# Patient Record
Sex: Male | Born: 1960 | Race: White | Hispanic: No | Smoking: Never smoker
Health system: Southern US, Community
[De-identification: ages and names within clinical notes are randomized; demographics above are authoritative.]

## PROBLEM LIST (undated history)

## (undated) DIAGNOSIS — I10 Essential (primary) hypertension: Secondary | ICD-10-CM

## (undated) HISTORY — PX: APPENDECTOMY: SHX54

---

## 2012-11-14 ENCOUNTER — Ambulatory Visit: Payer: Self-pay | Admitting: Family Medicine

## 2012-11-24 ENCOUNTER — Ambulatory Visit: Payer: Self-pay | Admitting: Family Medicine

## 2013-01-31 ENCOUNTER — Ambulatory Visit: Payer: Self-pay | Admitting: Orthopedic Surgery

## 2013-03-18 ENCOUNTER — Emergency Department: Payer: Self-pay | Admitting: Emergency Medicine

## 2013-03-18 LAB — CBC
HCT: 43.5 % (ref 40.0–52.0)
MCH: 33.8 pg (ref 26.0–34.0)
MCHC: 35.6 g/dL (ref 32.0–36.0)
MCV: 95 fL (ref 80–100)
Platelet: 190 10*3/uL (ref 150–440)
RDW: 12.6 % (ref 11.5–14.5)
WBC: 11.1 10*3/uL — ABNORMAL HIGH (ref 3.8–10.6)

## 2013-03-18 LAB — URINALYSIS, COMPLETE
Ketone: NEGATIVE
Nitrite: NEGATIVE
RBC,UR: NONE SEEN /HPF (ref 0–5)

## 2013-03-18 LAB — BASIC METABOLIC PANEL
Anion Gap: 7 (ref 7–16)
BUN: 17 mg/dL (ref 7–18)
Calcium, Total: 9.3 mg/dL (ref 8.5–10.1)
Chloride: 103 mmol/L (ref 98–107)
Creatinine: 1.25 mg/dL (ref 0.60–1.30)
EGFR (Non-African Amer.): >60
Glucose: 112 mg/dL — ABNORMAL HIGH (ref 65–99)
Osmolality: 274 (ref 275–301)
Potassium: 3.1 mmol/L — ABNORMAL LOW (ref 3.5–5.1)
Sodium: 136 mmol/L (ref 136–145)

## 2013-03-29 ENCOUNTER — Ambulatory Visit: Payer: Self-pay | Admitting: Urology

## 2014-10-11 NOTE — Op Note (Signed)
PATIENT NAME:  Manuel Medina, Manuel Medina DATE OF BIRTH:  04/07/1961  DATE OF PROCEDURE:  01/31/2013  PREOPERATIVE DIAGNOSIS:  Right knee osteoarthritis, medial meniscus tear.  POSTOPERATIVE DIAGNOSIS:  Right knee osteoarthritis, medial meniscus tear with plica band.    PROCEDURE:  Arthroscopy, right knee, with partial medial meniscectomy, excision of plica.   ANESTHESIA:  General.   SURGEON:  Leitha SchullerMichael J. Aveena Bari, M.D.   DESCRIPTION OF PROCEDURE:  The patient was brought to the Operating Room and after adequate anesthesia was obtained, the right leg was prepped and draped in the usual sterile fashion with the arthroscopic legholder applied.  After patient identification and timeout procedures were completed, the inferolateral portal was made and the arthroscope was introduced.  Initial inspection revealed mild to moderate patellofemoral degenerative change and a thick medial plica band that impinged over the femoral condyle.  The gutters were checked and there were no loose bodies.  Coming around to the medial compartment there was exposed bone on both femoral and tibial condyles.  An inferomedial portal was made and upon probing there was a complex tear of the posterior horn that had both horizontal and vertical components involving most of the posterior third and a portion of the middle third.  The ACL was intact and the lateral compartment appeared essentially normal.  At this point, a meniscal punch was used to debride the meniscus back to a stable margin and an ArthroCare wand used to smooth the tissue.  On further probing, there did not appear to be anymore loose meniscal tissue.  The knee was thoroughly irrigated until clear and the plica band was ablated with the use of the ArthroCare wand.  Following this, the instrumentation was withdrawn and 20 mL of 0.5% Sensorcaine with epinephrine was infiltrated into the area of the portals for postop analgesia.  Xeroform, 4 x 4's, Webril and Ace wrap  applied and the patient was sent to the recovery room in stable condition.   ESTIMATED BLOOD LOSS:  Minimal.   COMPLICATIONS:  None.   SPECIMEN:  None.    ____________________________ Leitha SchullerMichael J. Mayank Teuscher, MD mjm:ea D: 01/31/2013 18:58:54 ET T: 02/01/2013 03:24:22 ET JOB#: [id number]  cc: Leitha SchullerMichael J. Mustafa Potts, MD, <Dictator> Leitha SchullerMICHAEL J Darby Shadwick MD ELECTRONICALLY SIGNED 02/01/2013 7:19

## 2015-06-05 ENCOUNTER — Ambulatory Visit
Admission: EM | Admit: 2015-06-05 | Discharge: 2015-06-05 | Disposition: A | Discharge status: Home / Self Care | Payer: Worker's Compensation | Attending: Family Medicine | Admitting: Family Medicine

## 2015-06-05 DIAGNOSIS — S61210A Laceration without foreign body of right index finger without damage to nail, initial encounter: Secondary | ICD-10-CM

## 2015-06-05 HISTORY — DX: Essential (primary) hypertension: I10

## 2015-06-05 MED ORDER — MUPIROCIN 2 % EX OINT: 1.0000 "application " | TOPICAL_OINTMENT | Freq: Three times a day (TID) | CUTANEOUS | Status: DC

## 2015-06-05 MED ORDER — ONDANSETRON 8 MG PO TBDP
8.0000 mg | ORAL_TABLET | Freq: Once | ORAL | Status: AC
  Administered 2015-06-05: 8 mg via ORAL

## 2015-06-05 MED ORDER — TETANUS-DIPHTH-ACELL PERTUSSIS 5-2.5-18.5 LF-MCG/0.5 IM SUSP
0.5000 mL | Freq: Once | INTRAMUSCULAR | Status: AC
  Administered 2015-06-05: 0.5 mL via INTRAMUSCULAR

## 2015-06-05 NOTE — ED Provider Notes (Signed)
CSN: [id number]     Arrival date & time 06/05/15  0913 History   First MD Initiated Contact with Patient 06/05/15 1007    Nurses notes were reviewed. Chief Complaint  Patient presents with  . Laceration    Laceration of right second finger. Patient reports carrying sheet metal slipped and cut his finger. Still bleeding profusely and she is nauseous and throwing up.  (Consider location/radiation/quality/duration/timing/severity/associated sxs/prior Treatment) Patient is a 54 y.o. male presenting with skin laceration. The history is provided by the patient. No language interpreter was used.  Laceration Location:  Finger Finger laceration location:  R index finger Depth:  Through underlying tissue Bleeding: venous   Time since incident:  1 hour Laceration mechanism:  Metal edge Pain details:    Quality:  Aching   Severity:  Moderate Foreign body present:  No foreign bodies Relieved by:  Nothing Worsened by:  Movement Ineffective treatments:  Pressure Tetanus status:  Out of date   Past Medical History  Diagnosis Date  . Hypertension    Past Surgical History  Procedure Laterality Date  . Appendectomy     Family History  Problem Relation Age of Onset  . Stroke Father    Social History  Substance Use Topics  . Smoking status: Never Smoker   . Smokeless tobacco: None  . Alcohol Use: No    Review of Systems  Skin: Positive for wound.  All other systems reviewed and are negative.   Allergies  Penicillins  Home Medications   Prior to Admission medications   Medication Sig Start Date End Date Taking? Authorizing Provider  mupirocin ointment (BACTROBAN) 2 % Apply 1 application topically 3 (three) times daily. 06/05/15   Hassan RowanEugene Chino Sardo, MD   Meds Ordered and Administered this Visit   Medications  Tdap (BOOSTRIX) injection 0.5 mL (0.5 mLs Intramuscular Given 06/05/15 0955)  ondansetron (ZOFRAN-ODT) disintegrating tablet 8 mg (8 mg Oral Given 06/05/15 1003)    BP  122/79 mmHg  Pulse 80  Temp(Src) 96.8 F (36 C) (Tympanic)  Resp 18  Ht 5\' 4"  (1.626 m)  Wt 164 lb (74.39 kg)  BMI 28.14 kg/m2  SpO2 99% No data found.   Physical Exam  Constitutional: He is oriented to person, place, and time. He appears well-developed and well-nourished.  HENT:  Head: Normocephalic.  Musculoskeletal: He exhibits tenderness.  Patient has laceration at the DIP joint. This about 6 cm in length and covers most the palmar surface of the second finger. He has good range of motion of the finger and stable and is able to bend it completely. Sensation is present at the tip. It is bleeding profusely.  Neurological: He is alert and oriented to person, place, and time.  Skin: Skin is warm.  Vitals reviewed.   ED Course  .Marland Kitchen.Laceration Repair Date/Time: 06/05/2015 10:43 AM Performed by: Hassan RowanWADE, Ciin Brazzel Authorized by: Hassan RowanWADE, Jacier Gladu Consent: Verbal consent obtained. Body area: upper extremity Location details: right index finger Wound length (cm): 6. Foreign bodies: metal Nerve involvement: none Vascular damage: yes Anesthesia: local infiltration Local anesthetic: lidocaine 1% without epinephrine Patient sedated: no Irrigation solution: saline Amount of cleaning: standard Debridement: none Degree of undermining: none Skin closure: 3-0 nylon and Ethilon Technique: simple Approximation: close Approximation difficulty: simple Dressing: 4x4 sterile gauze, antibiotic ointment and splint Comments: Patient tolerated suture repair of the finger well. We'll place a splint on and have her return in 2 weeks for suture removal and Bactrim ointment will be placed. With recommend he wear the  splint at work 6 sutures were placed using 3-0 Ethilon   (including critical care time)  Labs Review Labs Reviewed - No data to display  Imaging Review No results found.   Visual Acuity Review  Right Eye Distance:   Left Eye Distance:   Bilateral Distance:    Right Eye Near:    Left Eye Near:    Bilateral Near:         MDM   1. Laceration of right index finger w/o foreign body w/o damage to nail, initial encounter      Laceration repair done well patient tolerated suture. This was also given as well for update.  Hassan Rowan, MD 06/05/15 [phone]

## 2015-06-05 NOTE — Discharge Instructions (Signed)
Laceration Care, Adult °A laceration is a cut that goes through all layers of the skin. The cut also goes into the tissue that is right under the skin. Some cuts heal on their own. Others need to be closed with stitches (sutures), staples, skin adhesive strips, or wound glue. Taking care of your cut lowers your risk of infection and helps your cut to heal better. °HOW TO TAKE CARE OF YOUR CUT °For stitches or staples: °· Keep the wound clean and dry. °· If you were given a bandage (dressing), you should change it at least one time per day or as told by your doctor. You should also change it if it gets wet or dirty. °· Keep the wound completely dry for the first 24 hours or as told by your doctor. After that time, you may take a shower or a bath. However, make sure that the wound is not soaked in water until after the stitches or staples have been removed. °· Clean the wound one time each day or as told by your doctor: °· Wash the wound with soap and water. °· Rinse the wound with water until all of the soap comes off. °· Pat the wound dry with a clean towel. Do not rub the wound. °· After you clean the wound, put a thin layer of antibiotic ointment on it as told by your doctor. This ointment: °· Helps to prevent infection. °· Keeps the bandage from sticking to the wound. °· Have your stitches or staples removed as told by your doctor. °If your doctor used skin adhesive strips:  °· Keep the wound clean and dry. °· If you were given a bandage, you should change it at least one time per day or as told by your doctor. You should also change it if it gets dirty or wet. °· Do not get the skin adhesive strips wet. You can take a shower or a bath, but be careful to keep the wound dry. °· If the wound gets wet, pat it dry with a clean towel. Do not rub the wound. °· Skin adhesive strips fall off on their own. You can trim the strips as the wound heals. Do not remove any strips that are still stuck to the wound. They will  fall off after a while. °If your doctor used wound glue: °· Try to keep your wound dry, but you may briefly wet it in the shower or bath. Do not soak the wound in water, such as by swimming. °· After you take a shower or a bath, gently pat the wound dry with a clean towel. Do not rub the wound. °· Do not do any activities that will make you really sweaty until the skin glue has fallen off on its own. °· Do not apply liquid, cream, or ointment medicine to your wound while the skin glue is still on. °· If you were given a bandage, you should change it at least one time per day or as told by your doctor. You should also change it if it gets dirty or wet. °· If a bandage is placed over the wound, do not let the tape for the bandage touch the skin glue. °· Do not pick at the glue. The skin glue usually stays on for 5-10 days. Then, it falls off of the skin. °General Instructions  °· To help prevent scarring, make sure to cover your wound with sunscreen whenever you are outside after stitches are removed, after adhesive strips are removed,   or when wound glue stays in place and the wound is healed. Make sure to wear a sunscreen of at least 30 SPF. °· Take over-the-counter and prescription medicines only as told by your doctor. °· If you were given antibiotic medicine or ointment, take or apply it as told by your doctor. Do not stop using the antibiotic even if your wound is getting better. °· Do not scratch or pick at the wound. °· Keep all follow-up visits as told by your doctor. This is important. °· Check your wound every day for signs of infection. Watch for: °¨ Redness, swelling, or pain. °¨ Fluid, blood, or pus. °· Raise (elevate) the injured area above the level of your heart while you are sitting or lying down, if possible. °GET HELP IF: °· You got a tetanus shot and you have any of these problems at the injection site: °¨ Swelling. °¨ Very bad pain. °¨ Redness. °¨ Bleeding. °· You have a fever. °· A wound that was  closed breaks open. °· You notice a bad smell coming from your wound or your bandage. °· You notice something coming out of the wound, such as wood or glass. °· Medicine does not help your pain. °· You have more redness, swelling, or pain at the site of your wound. °· You have fluid, blood, or pus coming from your wound. °· You notice a change in the color of your skin near your wound. °· You need to change the bandage often because fluid, blood, or pus is coming from the wound. °· You start to have a new rash. °· You start to have numbness around the wound. °GET HELP RIGHT AWAY IF: °· You have very bad swelling around the wound. °· Your pain suddenly gets worse and is very bad. °· You notice painful lumps near the wound or on skin that is anywhere on your body. °· You have a red streak going away from your wound. °· The wound is on your hand or foot and you cannot move a finger or toe like you usually can. °· The wound is on your hand or foot and you notice that your fingers or toes look pale or bluish. °  °This information is not intended to replace advice given to you by your health care provider. Make sure you discuss any questions you have with your health care provider. °  °Document Released: 11/24/2007 Document Revised: 10/22/2014 Document Reviewed: 06/03/2014 °Elsevier Interactive Patient Education ©2016 [employer] ° °Stitches, Staples, or Adhesive Wound Closure °Doctors use stitches (sutures), staples, and certain glue (skin adhesives) to hold your skin together while it heals (wound closure). You may need this treatment after you have surgery or if you cut your skin accidentally. These methods help your skin heal more quickly. They also make it less likely that you will have a scar. °WHAT ARE THE DIFFERENT KINDS OF WOUND CLOSURES? °There are many options for wound closure. The one that your doctor uses depends on how deep and large your wound is. °Adhesive Glue °To use this glue to close a wound, your  doctor holds the edges of the wound together and paints the glue on the surface of your skin. You may need more than one layer of glue. Then the wound may be covered with a light bandage (dressing). °This type of skin closure may be used for small wounds that are not deep (superficial). Using glue for wound closure is less painful than other methods. It does not require   a medicine that numbs the area. This method also leaves nothing to be removed. Adhesive glue is often used for children and on facial wounds. °Adhesive glue cannot be used for wounds that are deep, uneven, or bleeding. It is not used inside of a wound.  °Adhesive Strips °These strips are made of sticky (adhesive), porous paper. They are placed across your skin edges like a regular adhesive bandage. You leave them on until they fall off. °Adhesive strips may be used to close very superficial wounds. They may also be used along with sutures to improve closure of your skin edges.  °Sutures °Sutures are the oldest method of wound closure. Sutures can be made from natural or synthetic materials. They can be made from a material that your body can break down as your wound heals (absorbable), or they can be made from a material that needs to be removed from your skin (nonabsorbable). They come in many different strengths and sizes. °Your doctor attaches the sutures to a steel needle on one end. Sutures can be passed through your skin, or through the tissues beneath your skin. Then they are tied and cut. Your skin edges may be closed in one continuous stitch or in separate stitches. °Sutures are strong and can be used for all kinds of wounds. Absorbable sutures may be used to close tissues under the skin. The disadvantage of sutures is that they may cause skin reactions that lead to infection. Nonabsorbable sutures need to be removed. °Staples °When surgical staples are used to close a wound, the edges of your skin on both sides of the wound are brought  close together. A staple is placed across the wound, and an instrument secures the edges together. Staples are often used to close surgical cuts (incisions). °Staples are faster to use than sutures, and they cause less reaction from your skin. Staples need to be removed using a tool that bends the staples away from your skin. °HOW DO I CARE FOR MY WOUND CLOSURE? °· Take medicines only as told by your doctor. °· If you were prescribed an antibiotic medicine for your wound, finish it all even if you start to feel better. °· Use ointments or creams only as told by your doctor. °· Wash your hands with soap and water before and after touching your wound. °· Do not soak your wound in water. Do not take baths, swim, or use a hot tub until your doctor says it is okay. °· Ask your doctor when you can start showering. Cover your wound if told by your doctor. °· Do not take out your own sutures or staples. °· Do not pick at your wound. Picking can cause an infection. °· Keep all follow-up visits as told by your doctor. This is important. °HOW LONG WILL I HAVE MY WOUND CLOSURE?  °· Leave adhesive glue on your skin until the glue peels away. °· Leave adhesive strips on your skin until they fall off. °· Absorbable sutures will dissolve within several days. °· Nonabsorbable sutures and staples must be removed. The location of the wound will determine how long they stay in. This can range from several days to a couple of weeks. °WHEN SHOULD I SEEK HELP FOR MY WOUND CLOSURE? °Contact your doctor if: °· You have a fever. °· You have chills. °· You have redness, puffiness (swelling), or pain at the site of your wound. °· You have fluid, blood, or pus coming from your wound. °· There is a bad smell coming   from your wound. °· The skin edges of your wound start to separate after your sutures have been removed. °· Your wound becomes thick, raised, and darker in color after your sutures come out (scarring). °  °This information is not  intended to replace advice given to you by your health care provider. Make sure you discuss any questions you have with your health care provider. °  °Document Released: 04/04/2009 Document Revised: 06/28/2014 Document Reviewed: 11/14/2013 °Elsevier Interactive Patient Education ©2016 [employer] ° °

## 2015-06-05 NOTE — ED Notes (Signed)
Patient's wound continues to bleed. Patient nauseated and vomited approximately 100 ml emesis. Dr. Thurmond ButtsWade informed and Zofran 8 mg OTD given as ordered.

## 2015-06-05 NOTE — ED Notes (Signed)
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 Has deep laceration to right 2nd finger and a more superficial one just below. Continues to bleed profusely. Dr. Thurmond ButtsWade to bedside. Patient applying pressure to wound with hand elevated

## 2015-07-02 ENCOUNTER — Encounter: Payer: Self-pay | Admitting: Emergency Medicine

## 2015-07-02 ENCOUNTER — Ambulatory Visit
Admission: EM | Admit: 2015-07-02 | Discharge: 2015-07-02 | Disposition: A | Discharge status: Home / Self Care | Payer: Worker's Compensation | Attending: Family Medicine | Admitting: Family Medicine

## 2015-07-02 DIAGNOSIS — S6991XD Unspecified injury of right wrist, hand and finger(s), subsequent encounter: Secondary | ICD-10-CM

## 2015-07-02 NOTE — ED Provider Notes (Signed)
Patient presents today for reevaluation of right second digit laceration. Patient states that the wound has healed however he has decreased range of motion at the DIP joint. He denies any signs of infection. He has been working in full capacity at his job. This was a workman's comp injury.  ROS: Negative except mentioned above.  Vitals as per Epic. GENERAL: NAD MSK: mild swelling distally of right second digit, wound healed, no signs of infection, decreased flexion at DIP, mild numbness along distal radial aspect of digit (states improving), vascularly intact  NEURO: CN II-XII grossly intact    A/P: R Second Digit Injury- Wound healed. Recommend f/u with Tommie Maximiano CossAnne Moore FNP, patient may require OT/PT referral regarding range of motion, patient states that he will discuss this with his case manager, taper were given to patient discussing above.   Jolene ProvostKirtida Lorane Cousar, MD 07/02/15 [phone]

## 2015-07-02 NOTE — ED Notes (Signed)
Patient here for right 2nd finger wound check.  Patient has healed laceration repair with no signs of infection.

## 2016-09-13 ENCOUNTER — Encounter: Payer: Self-pay | Admitting: Emergency Medicine

## 2016-09-13 ENCOUNTER — Ambulatory Visit
Admission: EM | Admit: 2016-09-13 | Discharge: 2016-09-13 | Disposition: A | Discharge status: Home / Self Care | Payer: 59 | Attending: Emergency Medicine | Admitting: Emergency Medicine

## 2016-09-13 ENCOUNTER — Ambulatory Visit (INDEPENDENT_AMBULATORY_CARE_PROVIDER_SITE_OTHER): Payer: 59

## 2016-09-13 DIAGNOSIS — E876 Hypokalemia: Secondary | ICD-10-CM | POA: Diagnosis not present

## 2016-09-13 DIAGNOSIS — I10 Essential (primary) hypertension: Secondary | ICD-10-CM

## 2016-09-13 DIAGNOSIS — M25562 Pain in left knee: Secondary | ICD-10-CM

## 2016-09-13 LAB — BASIC METABOLIC PANEL
Anion gap: 10 (ref 5–15)
BUN: 14 mg/dL (ref 6–20)
CO2: 25 mmol/L (ref 22–32)
CREATININE: 1.21 mg/dL (ref 0.61–1.24)
Calcium: 9.2 mg/dL (ref 8.9–10.3)
Chloride: 98 mmol/L — ABNORMAL LOW (ref 101–111)
GFR calc Af Amer: >60 mL/min (ref >60)
GLUCOSE: 101 mg/dL — AB (ref 65–99)
Potassium: 3.3 mmol/L — ABNORMAL LOW (ref 3.5–5.1)
Sodium: 133 mmol/L — ABNORMAL LOW (ref 135–145)

## 2016-09-13 LAB — CBC WITH DIFFERENTIAL/PLATELET
Basophils Absolute: 0 10*3/uL (ref 0–0.1)
Basophils Relative: 0 %
EOS ABS: 0 10*3/uL (ref 0–0.7)
EOS PCT: 0 %
HCT: 45.2 % (ref 40.0–52.0)
Hemoglobin: 15.5 g/dL (ref 13.0–18.0)
LYMPHS ABS: 1.1 10*3/uL (ref 1.0–3.6)
LYMPHS PCT: 10 %
MCH: 32.6 pg (ref 26.0–34.0)
MCHC: 34.4 g/dL (ref 32.0–36.0)
MCV: 94.7 fL (ref 80.0–100.0)
Monocytes Absolute: 1.1 10*3/uL — ABNORMAL HIGH (ref 0.2–1.0)
Monocytes Relative: 9 %
Neutro Abs: 9 10*3/uL — ABNORMAL HIGH (ref 1.4–6.5)
Neutrophils Relative %: 81 %
PLATELETS: 209 10*3/uL (ref 150–440)
RBC: 4.78 MIL/uL (ref 4.40–5.90)
RDW: 12.6 % (ref 11.5–14.5)
WBC: 11.2 10*3/uL — ABNORMAL HIGH (ref 3.8–10.6)

## 2016-09-13 LAB — URIC ACID: Uric Acid, Serum: 8.3 mg/dL — ABNORMAL HIGH (ref 4.4–7.6)

## 2016-09-13 MED ORDER — KETOROLAC TROMETHAMINE 60 MG/2ML IM SOLN
30.0000 mg | Freq: Once | INTRAMUSCULAR | Status: AC
  Administered 2016-09-13: 30 mg via INTRAMUSCULAR

## 2016-09-13 MED ORDER — LISINOPRIL 5 MG PO TABS: 5.0000 mg | ORAL_TABLET | Freq: Every day | ORAL | 0 refills | Status: DC

## 2016-09-13 MED ORDER — PREDNISONE 10 MG PO TABS: ORAL_TABLET | ORAL | 0 refills | Status: DC

## 2016-09-13 MED ORDER — ONDANSETRON 8 MG PO TBDP
8.0000 mg | ORAL_TABLET | Freq: Once | ORAL | Status: AC
  Administered 2016-09-13: 8 mg via ORAL

## 2016-09-13 MED ORDER — POTASSIUM CHLORIDE CRYS ER 20 MEQ PO TBCR
20.0000 meq | EXTENDED_RELEASE_TABLET | Freq: Once | ORAL | Status: AC
  Administered 2016-09-13: 20 meq via ORAL

## 2016-09-13 MED ORDER — ONDANSETRON HCL 4 MG/2ML IJ SOLN: 8.0000 mg | Freq: Once | INTRAMUSCULAR | Status: DC

## 2016-09-13 MED ORDER — OXYCODONE-ACETAMINOPHEN 5-325 MG PO TABS: 1.0000 | ORAL_TABLET | Freq: Three times a day (TID) | ORAL | 0 refills | Status: DC | PRN

## 2016-09-13 NOTE — ED Provider Notes (Signed)
[facility] ____________________________________________  Time seen: Approximately 1:35 PM  I have reviewed the triage vital signs and the nursing notes.   HISTORY  Chief Complaint Knee Pain (left)   HPI Manuel Medina is a 56 y.o. male presenting for the complaints of 3 days of left knee pain. Patient reports woke up with pain and states pain is severe. Patient reports he has a history of same type of pain occurring in the past and states if he gets to taking ibuprofen and icing it usually it would go away. Patient reports pain has continued and not gone away. Reports he has been evaluated for gout in the past and told it was negative. Patient reports he has remained ambulatory but today he has been using crutches because of the pain but able to still walk. Denies any fall, trauma or injury. Reports he has chronic bilateral knee problems including osteoarthritis. Denies any recent joint pain. Denies family history of gout. Denies renal insufficiency. Denies recent diet changes. Denies known trigger.  States pain is mostly with direct palpation and movement. States pain is currently 9 out of 10 to left anterior knee. Denies any pain in the back of the knee or the rest of the leg. Denies any break in skin, rash or skin changes. Reports has continued to eat and drink well. No medications being taken today. Reports last took Advil yesterday. Reports otherwise feeling well.  Denies chest pain, shortness of breath, abdominal pain, headaches, dizziness, vision changes, weakness, dysuria, extremity pain, extremity swelling or rash. Denies recent sickness. Denies recent antibiotic use.   Patient reports that he does have a history of high blood pressure and thinks he was taking lisinopril. Reports he has not been taking his blood pressure medication in well over a year as he stopped following up with his Dr.   Past Medical History:  Diagnosis Date  . Hypertension     There are no  active problems to display for this patient.   Past Surgical History:  Procedure Laterality Date  . APPENDECTOMY       No current facility-administered medications for this encounter.   Current Outpatient Prescriptions:  .  lisinopril (PRINIVIL,ZESTRIL) 5 MG tablet, Take 1 tablet (5 mg total) by mouth daily., Disp: 21 tablet, Rfl: 0 .  oxyCODONE-acetaminophen (ROXICET) 5-325 MG tablet, Take 1 tablet by mouth every 8 (eight) hours as needed for moderate pain or severe pain (Do not drive or operate heavy machinery while taking as can cause drowsiness.)., Disp: 9 tablet, Rfl: 0 .  predniSONE (DELTASONE) 10 MG tablet, Start 60 mg po day one, then 50 mg po day two, taper by 10 mg daily until complete., Disp: 21 tablet, Rfl: 0  Allergies Penicillins  Family History  Problem Relation Age of Onset  . Stroke Father     Social History Social History  Substance Use Topics  . Smoking status: Never Smoker  . Smokeless tobacco: Never Used  . Alcohol use No    Review of Systems Constitutional: No fever/chills Eyes: No visual changes. ENT: No sore throat. Cardiovascular: Denies chest pain. Respiratory: Denies shortness of breath. Gastrointestinal: No abdominal pain.  No nausea, no vomiting.  No diarrhea.  No constipation. Genitourinary: Negative for dysuria. Musculoskeletal: Negative for back pain.As above. Skin: Negative for rash. Neurological: Negative for  focal weakness or numbness.  10-point ROS otherwise negative.  ____________________________________________   PHYSICAL EXAM:  VITAL SIGNS: ED Triage Vitals  Enc Vitals Group     BP  09/13/16 1304 (S) (!) 180/105     Pulse Rate 09/13/16 1304 81     Resp 09/13/16 1304 17     Temp 09/13/16 1304 98.4 F (36.9 C)     Temp Source 09/13/16 1304 Oral     SpO2 09/13/16 1304 98 %     Weight 09/13/16 1305 156 lb (70.8 kg)     Height 09/13/16 1305 5\' 4"  (1.626 m)     Head Circumference --      Peak Flow --      Pain Score  09/13/16 1305 10     Pain Loc --      Pain Edu? --      Excl. in GC? --    Vitals:   09/13/16 1304 09/13/16 1305 09/13/16 1414 09/13/16 1456  BP: (S) (!) 180/105  (!) 199/113 (!) 160/92  Pulse: 81     Resp: 17     Temp: 98.4 F (36.9 C)     TempSrc: Oral     SpO2: 98%     Weight:  156 lb (70.8 kg)    Height:  5\' 4"  (1.626 m)     Constitutional: Alert and oriented. Well appearing and in no acute distress. Eyes: Conjunctivae are normal. PERRL. EOMI. ENT      Head: Normocephalic and atraumatic.      Mouth/Throat: Mucous membranes are moist. Cardiovascular: Normal rate, regular rhythm. Grossly normal heart sounds.  Good peripheral circulation. Respiratory: Normal respiratory effort without tachypnea nor retractions. Breath sounds are clear and equal bilaterally. No wheezes, rales, rhonchi. Gastrointestinal: Soft and nontender. No distention.  No CVA tenderness. Musculoskeletal No midline cervical, thoracic or lumbar tenderness to palpation. Bilateral pedal pulses equal and easily palpated. Except: Left anterior knee diffuse edema and tenderness to anterior knee, guarding knee, no posterior knee or posterior leg tenderness, calf nontender, patient refuses to attempt at range of motion stating due to pain, no erythema or skin changes, skin appears to be intact, anterior knee warm to touch, left lower extremity nontender otherwise. Left lower extremity otherwise with normal range of motion. Neurologic:  Normal speech and language. Speech is normal. No gait instability.  Skin:  Skin is warm, dry and intact. No rash noted. Psychiatric: Mood and affect are normal. Speech and behavior are normal. Patient exhibits appropriate insight and judgment   ___________________________________________   LABS (all labs ordered are listed, but only abnormal results are displayed)  Labs Reviewed  CBC WITH DIFFERENTIAL/PLATELET - Abnormal; Notable for the following:       Result Value   WBC 11.2 (*)     Neutro Abs 9.0 (*)    Monocytes Absolute 1.1 (*)    All other components within normal limits  BASIC METABOLIC PANEL - Abnormal; Notable for the following:    Sodium 133 (*)    Potassium 3.3 (*)    Chloride 98 (*)    Glucose, Bld 101 (*)    All other components within normal limits  URIC ACID - Abnormal; Notable for the following:    Uric Acid, Serum 8.3 (*)    All other components within normal limits    RADIOLOGY  Dg Knee Complete 4 Views Left  Result Date: 09/13/2016 CLINICAL DATA:  Anterior knee pain and swelling. EXAM: LEFT KNEE - COMPLETE 4+ VIEW COMPARISON:  None. FINDINGS: There is a knee effusion in the suprapatellar bursa. Marginal spurring and some loss of articular space in the patellofemoral joint. Low-level prepatellar subcutaneous edema. No visible fracture. Mild  degenerative chondral thinning in the medial compartment. IMPRESSION: 1. Knee effusion in the suprapatellar bursa. 2. Chondral thinning in the patellofemoral joint and to a lesser extent in the medial compartment. 3. If pain persists despite conservative therapy, MRI may be warranted for further characterization. Electronically Signed   By: Gaylyn Rong M.D.   On: 09/13/2016 14:12   ____________________________________________   PROCEDURES Procedures    INITIAL IMPRESSION / ASSESSMENT AND PLAN / ED COURSE  Pertinent labs & imaging results that were available during my care of the patient were reviewed by me and considered in my medical decision making (see chart for details).  Patient regarding left knee and states unable to move due to the pain. Patient states he has full range of motion but does not allow for full exam based on pain and guarding. Discussed in detail with patient recommend further evaluation in the emergency room due to pain complaints as well as for further evaluation including laboratory studies, pain medication and possible knee arthrocentesis, including up to excluding septic joint.  Patient states he does not want to go to the ER and will not be going to the emergency room. Patient states he understands his risks of infection and worsening knee pain and condition and does not want to go to the ER. Patient agrees for left knee x-ray as well as laboratory studies to be done in urgent care at this time. Will evaluate CBC, BMP uric acid and left knee x-ray.  X-ray results per radiologist knee effusion in the suprapatellar bursa, chondral thinning in the patellofemoral joint and to a lesser extent the medial compartment, no visible fracture. 30 mg IM Toradol given once in urgent care, and patient reports mild improvement of pain post toradol.   Laboratory results reviewed, and discussed with patient. Potassium 3.3, 20 mEq of potassium given in urgent care. Discussed other lab results with patient, including uric acid elevation. Suspect left knee pain acute gout. As patient's symptoms have been for the last 3 days no clear indication for colchicine use. Will treat patient with prednisone taper and when necessary Percocet for breakthrough pain. [place] controlled substance database reviewed, no controlled substances documented in last 1 year. Continue crutches and supportive care.  Patient also with history of chronic hypertension and not currently on any medications. Patient agrees to establish and follow up with primary care physician. Will restart patient on oral 5 mg lisinopril and directed for close follow-up and monitoring.Discussed indication, risks and benefits of medications with patient.  Discussed follow up with Primary care physician this week. Discussed follow up and return parameters including no resolution or any worsening concerns. Patient verbalized understanding and agreed to plan.   ____________________________________________   FINAL CLINICAL IMPRESSION(S) / ED DIAGNOSES  Final diagnoses:  Acute pain of left knee  Hypertension, unspecified type  Hypokalemia      Discharge Medication List as of 09/13/2016  2:47 PM    START taking these medications   Details  lisinopril (PRINIVIL,ZESTRIL) 5 MG tablet Take 1 tablet (5 mg total) by mouth daily., Starting Mon 09/13/2016, Normal    oxyCODONE-acetaminophen (ROXICET) 5-325 MG tablet Take 1 tablet by mouth every 8 (eight) hours as needed for moderate pain or severe pain (Do not drive or operate heavy machinery while taking as can cause drowsiness.)., Starting Mon 09/13/2016, Print    predniSONE (DELTASONE) 10 MG tablet Start 60 mg po day one, then 50 mg po day two, taper by 10 mg daily until complete., Normal  Note: This dictation was prepared with Dragon dictation along with smaller phrase technology. Any transcriptional errors that result from this process are unintentional.         Renford Dills, NP 09/13/16 1608    Renford Dills, NP 09/13/16 1609

## 2016-09-13 NOTE — Discharge Instructions (Signed)
Take medication as prescribed. Rest. Elevate. Drink plenty of fluids.   Establish Primary care and closely follow up. You need blood pressure to be closely monitored as well as potassium and labs.   Follow up with orthopedic as discussed.    Return to Urgent care or Emergency room for new or worsening concerns.

## 2016-09-13 NOTE — ED Triage Notes (Signed)
Patient c/o left knee pain that has gotten worse over the past 3 days. Patient denies injury.

## 2016-11-19 DIAGNOSIS — E782 Mixed hyperlipidemia: Secondary | ICD-10-CM | POA: Insufficient documentation

## 2016-11-19 DIAGNOSIS — N183 Chronic kidney disease, stage 3 unspecified: Secondary | ICD-10-CM | POA: Insufficient documentation

## 2020-06-02 ENCOUNTER — Inpatient Hospital Stay
Admission: EM | Admit: 2020-06-02 | Discharge: 2020-06-03 | DRG: 281 | Disposition: A | Discharge status: Other Acute Inpatient Hospital | Payer: Self-pay | Source: Ambulatory Visit | Attending: Obstetrics and Gynecology | Admitting: Obstetrics and Gynecology

## 2020-06-02 ENCOUNTER — Emergency Department: Payer: Self-pay

## 2020-06-02 ENCOUNTER — Other Ambulatory Visit: Payer: Self-pay

## 2020-06-02 ENCOUNTER — Encounter
Admission: EM | Disposition: A | Discharge status: Other Acute Inpatient Hospital | Payer: Self-pay | Source: Ambulatory Visit | Attending: Family Medicine

## 2020-06-02 DIAGNOSIS — Z79899 Other long term (current) drug therapy: Secondary | ICD-10-CM

## 2020-06-02 DIAGNOSIS — E860 Dehydration: Secondary | ICD-10-CM | POA: Diagnosis present

## 2020-06-02 DIAGNOSIS — I214 Non-ST elevation (NSTEMI) myocardial infarction: Principal | ICD-10-CM

## 2020-06-02 DIAGNOSIS — R7989 Other specified abnormal findings of blood chemistry: Secondary | ICD-10-CM

## 2020-06-02 DIAGNOSIS — N1831 Chronic kidney disease, stage 3a: Secondary | ICD-10-CM | POA: Diagnosis present

## 2020-06-02 DIAGNOSIS — Z20822 Contact with and (suspected) exposure to covid-19: Secondary | ICD-10-CM | POA: Diagnosis present

## 2020-06-02 DIAGNOSIS — K219 Gastro-esophageal reflux disease without esophagitis: Secondary | ICD-10-CM | POA: Diagnosis present

## 2020-06-02 DIAGNOSIS — E785 Hyperlipidemia, unspecified: Secondary | ICD-10-CM | POA: Diagnosis present

## 2020-06-02 DIAGNOSIS — R778 Other specified abnormalities of plasma proteins: Secondary | ICD-10-CM

## 2020-06-02 DIAGNOSIS — R739 Hyperglycemia, unspecified: Secondary | ICD-10-CM | POA: Diagnosis present

## 2020-06-02 DIAGNOSIS — I10 Essential (primary) hypertension: Secondary | ICD-10-CM

## 2020-06-02 DIAGNOSIS — Z880 Allergy status to penicillin: Secondary | ICD-10-CM

## 2020-06-02 DIAGNOSIS — E876 Hypokalemia: Secondary | ICD-10-CM | POA: Diagnosis present

## 2020-06-02 DIAGNOSIS — I2510 Atherosclerotic heart disease of native coronary artery without angina pectoris: Secondary | ICD-10-CM | POA: Diagnosis present

## 2020-06-02 DIAGNOSIS — N179 Acute kidney failure, unspecified: Secondary | ICD-10-CM | POA: Diagnosis present

## 2020-06-02 DIAGNOSIS — I129 Hypertensive chronic kidney disease with stage 1 through stage 4 chronic kidney disease, or unspecified chronic kidney disease: Secondary | ICD-10-CM | POA: Diagnosis present

## 2020-06-02 HISTORY — PX: LEFT HEART CATH AND CORONARY ANGIOGRAPHY: CATH118249

## 2020-06-02 LAB — BASIC METABOLIC PANEL
Anion gap: 8 (ref 5–15)
BUN: 17 mg/dL (ref 6–20)
CO2: 27 mmol/L (ref 22–32)
Calcium: 8.1 mg/dL — ABNORMAL LOW (ref 8.9–10.3)
Chloride: 105 mmol/L (ref 98–111)
Creatinine, Ser: 1.38 mg/dL — ABNORMAL HIGH (ref 0.61–1.24)
GFR, Estimated: 60 mL/min — ABNORMAL LOW (ref >60)
Glucose, Bld: 96 mg/dL (ref 70–99)
Potassium: 3.2 mmol/L — ABNORMAL LOW (ref 3.5–5.1)
Sodium: 140 mmol/L (ref 135–145)

## 2020-06-02 LAB — COMPREHENSIVE METABOLIC PANEL
ALT: 37 U/L (ref 0–44)
AST: 41 U/L (ref 15–41)
Albumin: 4 g/dL (ref 3.5–5.0)
Alkaline Phosphatase: 32 U/L — ABNORMAL LOW (ref 38–126)
Anion gap: 12 (ref 5–15)
BUN: 18 mg/dL (ref 6–20)
CO2: 24 mmol/L (ref 22–32)
Calcium: 9.1 mg/dL (ref 8.9–10.3)
Chloride: 105 mmol/L (ref 98–111)
Creatinine, Ser: 1.44 mg/dL — ABNORMAL HIGH (ref 0.61–1.24)
GFR, Estimated: 57 mL/min — ABNORMAL LOW (ref >60)
Glucose, Bld: 146 mg/dL — ABNORMAL HIGH (ref 70–99)
Potassium: 3 mmol/L — ABNORMAL LOW (ref 3.5–5.1)
Sodium: 141 mmol/L (ref 135–145)
Total Bilirubin: 0.9 mg/dL (ref 0.3–1.2)
Total Protein: 8.3 g/dL — ABNORMAL HIGH (ref 6.5–8.1)

## 2020-06-02 LAB — CBC
HCT: 45.4 % (ref 39.0–52.0)
Hemoglobin: 16.6 g/dL (ref 13.0–17.0)
MCH: 34.2 pg — ABNORMAL HIGH (ref 26.0–34.0)
MCHC: 36.6 g/dL — ABNORMAL HIGH (ref 30.0–36.0)
MCV: 93.6 fL (ref 80.0–100.0)
Platelets: 214 10*3/uL (ref 150–400)
RBC: 4.85 MIL/uL (ref 4.22–5.81)
RDW: 11.5 % (ref 11.5–15.5)
WBC: 6.9 10*3/uL (ref 4.0–10.5)
nRBC: 0 % (ref 0.0–0.2)

## 2020-06-02 LAB — TROPONIN I (HIGH SENSITIVITY)
Troponin I (High Sensitivity): 3106 ng/L (ref <18)
Troponin I (High Sensitivity): 4435 ng/L (ref <18)

## 2020-06-02 LAB — RESP PANEL BY RT-PCR (FLU A&B, COVID) ARPGX2
Influenza A by PCR: NEGATIVE
Influenza B by PCR: NEGATIVE
SARS Coronavirus 2 by RT PCR: NEGATIVE

## 2020-06-02 LAB — LIPASE, BLOOD: Lipase: 39 U/L (ref 11–51)

## 2020-06-02 SURGERY — LEFT HEART CATH AND CORONARY ANGIOGRAPHY
Anesthesia: Moderate Sedation

## 2020-06-02 MED ORDER — IOHEXOL 300 MG/ML  SOLN
INTRAMUSCULAR | Status: DC | PRN
  Administered 2020-06-02: 16:00:00 85 mL

## 2020-06-02 MED ORDER — ASPIRIN 81 MG PO CHEW: 81.0000 mg | CHEWABLE_TABLET | ORAL | Status: DC

## 2020-06-02 MED ORDER — HEPARIN BOLUS VIA INFUSION
4000.0000 [IU] | Freq: Once | INTRAVENOUS | Status: AC
  Administered 2020-06-02: 12:00:00 4000 [IU] via INTRAVENOUS
  Filled 2020-06-02: qty 4000

## 2020-06-02 MED ORDER — SODIUM CHLORIDE 0.9% FLUSH: 3.0000 mL | INTRAVENOUS | Status: DC | PRN

## 2020-06-02 MED ORDER — SODIUM CHLORIDE 0.9 % IV SOLN: INTRAVENOUS | Status: AC

## 2020-06-02 MED ORDER — LIDOCAINE HCL (PF) 1 % IJ SOLN
INTRAMUSCULAR | Status: DC | PRN
  Administered 2020-06-02: 15 mL

## 2020-06-02 MED ORDER — HEPARIN (PORCINE) 25000 UT/250ML-% IV SOLN: 865.0000 [IU]/h | INTRAVENOUS | Status: DC

## 2020-06-02 MED ORDER — LABETALOL HCL 5 MG/ML IV SOLN
10.0000 mg | Freq: Once | INTRAVENOUS | Status: AC
  Administered 2020-06-02: 12:00:00 10 mg via INTRAVENOUS
  Filled 2020-06-02: qty 4

## 2020-06-02 MED ORDER — ASPIRIN 81 MG PO CHEW
81.0000 mg | CHEWABLE_TABLET | Freq: Every day | ORAL | Status: DC
  Administered 2020-06-03: 11:00:00 81 mg via ORAL
  Filled 2020-06-02: qty 1

## 2020-06-02 MED ORDER — ACETAMINOPHEN 325 MG PO TABS
650.0000 mg | ORAL_TABLET | ORAL | Status: DC | PRN
  Administered 2020-06-02: 650 mg via ORAL

## 2020-06-02 MED ORDER — FENTANYL CITRATE (PF) 100 MCG/2ML IJ SOLN
INTRAMUSCULAR | Status: AC
  Filled 2020-06-02: qty 2

## 2020-06-02 MED ORDER — NITROGLYCERIN 0.4 MG SL SUBL: 0.4000 mg | SUBLINGUAL_TABLET | SUBLINGUAL | Status: DC | PRN

## 2020-06-02 MED ORDER — MIDAZOLAM HCL 2 MG/2ML IJ SOLN
INTRAMUSCULAR | Status: DC | PRN
  Administered 2020-06-02: 1 mg via INTRAVENOUS

## 2020-06-02 MED ORDER — HYDRALAZINE HCL 20 MG/ML IJ SOLN: 10.0000 mg | INTRAMUSCULAR | Status: AC | PRN

## 2020-06-02 MED ORDER — POTASSIUM CHLORIDE 10 MEQ/100ML IV SOLN
10.0000 meq | INTRAVENOUS | Status: AC
  Administered 2020-06-02: 14:00:00 10 meq via INTRAVENOUS
  Filled 2020-06-02: qty 100

## 2020-06-02 MED ORDER — SODIUM CHLORIDE 0.9 % WEIGHT BASED INFUSION: 1.0000 mL/kg/h | INTRAVENOUS | Status: DC

## 2020-06-02 MED ORDER — SODIUM CHLORIDE 0.9 % IV BOLUS
500.0000 mL | Freq: Once | INTRAVENOUS | Status: AC
  Administered 2020-06-02: 13:00:00 500 mL via INTRAVENOUS

## 2020-06-02 MED ORDER — LABETALOL HCL 5 MG/ML IV SOLN: 10.0000 mg | INTRAVENOUS | Status: AC | PRN

## 2020-06-02 MED ORDER — ONDANSETRON HCL 4 MG/2ML IJ SOLN: 4.0000 mg | Freq: Four times a day (QID) | INTRAMUSCULAR | Status: DC | PRN

## 2020-06-02 MED ORDER — HEPARIN (PORCINE) IN NACL 1000-0.9 UT/500ML-% IV SOLN
INTRAVENOUS | Status: DC | PRN
  Administered 2020-06-02: 500 mL

## 2020-06-02 MED ORDER — HEPARIN (PORCINE) IN NACL 1000-0.9 UT/500ML-% IV SOLN
INTRAVENOUS | Status: AC
  Filled 2020-06-02: qty 1000

## 2020-06-02 MED ORDER — LIDOCAINE HCL (PF) 1 % IJ SOLN
INTRAMUSCULAR | Status: AC
  Filled 2020-06-02: qty 30

## 2020-06-02 MED ORDER — NITROGLYCERIN 0.4 MG SL SUBL
0.4000 mg | SUBLINGUAL_TABLET | SUBLINGUAL | Status: DC | PRN
Start: 2020-06-02 — End: 2020-06-04

## 2020-06-02 MED ORDER — SODIUM CHLORIDE 0.9 % WEIGHT BASED INFUSION
1.0000 mL/kg/h | INTRAVENOUS | Status: AC
  Administered 2020-06-02 (×2): 1 mL/kg/h via INTRAVENOUS

## 2020-06-02 MED ORDER — SODIUM CHLORIDE 0.9% FLUSH
3.0000 mL | Freq: Two times a day (BID) | INTRAVENOUS | Status: DC
  Administered 2020-06-03: 11:00:00 3 mL via INTRAVENOUS

## 2020-06-02 MED ORDER — ASPIRIN EC 81 MG PO TBEC: 81.0000 mg | DELAYED_RELEASE_TABLET | Freq: Every day | ORAL | Status: DC

## 2020-06-02 MED ORDER — SODIUM CHLORIDE 0.9% FLUSH
3.0000 mL | Freq: Two times a day (BID) | INTRAVENOUS | Status: DC
  Administered 2020-06-02 – 2020-06-03 (×3): 3 mL via INTRAVENOUS

## 2020-06-02 MED ORDER — SODIUM CHLORIDE 0.9 % IV SOLN: 250.0000 mL | INTRAVENOUS | Status: DC | PRN

## 2020-06-02 MED ORDER — HYDRALAZINE HCL 20 MG/ML IJ SOLN: 10.0000 mg | Freq: Three times a day (TID) | INTRAMUSCULAR | Status: DC | PRN

## 2020-06-02 MED ORDER — MORPHINE SULFATE (PF) 4 MG/ML IV SOLN
4.0000 mg | Freq: Once | INTRAVENOUS | Status: AC
  Administered 2020-06-02: 12:00:00 4 mg via INTRAVENOUS
  Filled 2020-06-02: qty 1

## 2020-06-02 MED ORDER — HEPARIN (PORCINE) 25000 UT/250ML-% IV SOLN
865.0000 [IU]/h | INTRAVENOUS | Status: DC
  Administered 2020-06-02: 12:00:00 850 [IU]/h via INTRAVENOUS
  Filled 2020-06-02: qty 250

## 2020-06-02 MED ORDER — ONDANSETRON HCL 4 MG/2ML IJ SOLN
4.0000 mg | Freq: Four times a day (QID) | INTRAMUSCULAR | Status: DC | PRN
  Administered 2020-06-03: 08:00:00 4 mg via INTRAVENOUS
  Filled 2020-06-02: qty 2

## 2020-06-02 MED ORDER — FENTANYL CITRATE (PF) 100 MCG/2ML IJ SOLN
INTRAMUSCULAR | Status: DC | PRN
  Administered 2020-06-02: 25 ug via INTRAVENOUS

## 2020-06-02 MED ORDER — MIDAZOLAM HCL 2 MG/2ML IJ SOLN
INTRAMUSCULAR | Status: AC
  Filled 2020-06-02: qty 2

## 2020-06-02 MED ORDER — ACETAMINOPHEN 325 MG PO TABS
650.0000 mg | ORAL_TABLET | ORAL | Status: DC | PRN
  Administered 2020-06-03: 650 mg via ORAL
  Filled 2020-06-02 (×2): qty 2

## 2020-06-02 MED ORDER — ONDANSETRON HCL 4 MG/2ML IJ SOLN
4.0000 mg | Freq: Once | INTRAMUSCULAR | Status: AC
  Administered 2020-06-02: 12:00:00 4 mg via INTRAVENOUS
  Filled 2020-06-02: qty 2

## 2020-06-02 MED ORDER — ATORVASTATIN CALCIUM 20 MG PO TABS
40.0000 mg | ORAL_TABLET | Freq: Every day | ORAL | Status: DC
  Administered 2020-06-02 – 2020-06-03 (×2): 40 mg via ORAL
  Filled 2020-06-02 (×2): qty 2

## 2020-06-02 MED ORDER — LISINOPRIL 5 MG PO TABS: 5.0000 mg | ORAL_TABLET | Freq: Every day | ORAL | Status: DC

## 2020-06-02 MED ORDER — SODIUM CHLORIDE 0.9 % WEIGHT BASED INFUSION: 3.0000 mL/kg/h | INTRAVENOUS | Status: DC

## 2020-06-02 SURGICAL SUPPLY — 8 items
CATH INFINITI 5FR JL4 (CATHETERS) ×2 IMPLANT
CATH INFINITI JR4 5F (CATHETERS) ×2 IMPLANT
DEVICE CLOSURE MYNXGRIP 5F (Vascular Products) ×2 IMPLANT
KIT MANI 3VAL PERCEP (MISCELLANEOUS) ×2 IMPLANT
NEEDLE PERC 18GX7CM (NEEDLE) ×2 IMPLANT
PACK CARDIAC CATH (CUSTOM PROCEDURE TRAY) ×2 IMPLANT
SHEATH AVANTI 5FR X 11CM (SHEATH) ×2 IMPLANT
WIRE GUIDERIGHT .035X150 (WIRE) ×2 IMPLANT

## 2020-06-02 NOTE — Consult Note (Signed)
Brief consult note Patient presents with non-STEMI Troponins peaked at 4000 History suggestive of unstable angina non-STEMI Mild renal insufficiency No previous cardiac history Hemodynamically stable EKG diffuse ST depression Currently pain-free Exam unremarkable  Impression Non-STEMI  Recommend diagnostic cardiac cath with possible PCI later today Aspirin 325 daily Heparin weight-based for ACS N.p.o. Case discussed with patient

## 2020-06-02 NOTE — ED Triage Notes (Signed)
First Nurse Note:  Arrives via [facility] from [facility].  Over past week has had symptoms of GERD and being treated for symptoms with improvement.  Today c/o Chest Pain.  Patient is HTN, 2 inches of NTP and 325 mg ASA given. Patient has history of HTN, but has been off medication x several years.

## 2020-06-02 NOTE — ED Notes (Signed)
Report given to Darletta Moll, RN in the cath lab at this time.

## 2020-06-02 NOTE — CV Procedure (Signed)
Brief cath note Left heart cath because of non-STEMI Groin approach Patient was found to have preserved left ventricular function 50 to 55% Patient has three-vessel coronary disease Recommend coronary bypass surgery We will try to arrange transfer to tertiary care center Resume heparin after sheath removal

## 2020-06-02 NOTE — Consult Note (Signed)
ANTICOAGULATION CONSULT NOTE - Consult  Pharmacy Consult for Heparin gtt Indication: chest pain/ACS  Allergies  Allergen Reactions  . Penicillins Rash    Patient Measurements: Height: 5\' 4"  (162.6 cm) Weight: 72.1 kg (158 lb 15.2 oz) IBW/kg (Calculated) : 59.2 Heparin Dosing Weight: 72.1 kg  Vital Signs: Temp: 98.4 F (36.9 C) (12/13 1433) Temp Source: Oral (12/13 1433) BP: 138/102 (12/13 1700) Pulse Rate: 73 (12/13 1700)  Labs: Recent Labs    06/02/20 0952 06/02/20 1127  HGB 16.6  --   HCT 45.4  --   PLT 214  --   CREATININE 1.44*  --   TROPONINIHS 3,106* 4,435*    Estimated Creatinine Clearance: 51.6 mL/min (A) (by C-G formula based on SCr of 1.44 mg/dL (H)).   Medications:  PTA - ASA 81mg , Prednisone 10mg  QD  Assessment: No current interacting inpatient medications. No AC pertinent drug allergy.   Goal of Therapy:  Heparin level 0.3-0.7 units/ml Monitor platelets by anticoagulation protocol: Yes   Plan:  Pt underwent cath procedure with sheath removal at 1600; will plan to restart heparin gtt at prior rate of 850units/hr 8hrs post @2230  on 12/13.  Check anti-Xa level in 6 hours and daily while on heparin Continue to monitor H&H and platelets  Elayna Tobler 06/02/2020,5:18 PM

## 2020-06-02 NOTE — ED Provider Notes (Signed)
[facility] Emergency Department Provider Note  ____________________________________________   Event Date/Time   First MD Initiated Contact with Patient 06/02/20 1109     (approximate)  I have reviewed the triage vital signs and the nursing notes.   HISTORY  Chief Complaint Abdominal Pain    HPI STERLIN Medina is a 59 y.o. male  With h/o HTN here with chest pain, pressure. Pt reports 2-3 weeks of progressively worsening pain and pressure. He thought it was reflux and was seen by his PCP, at which time he was started on an antacid. He feels like this helped his pain but he's had ongoing pressure since then. No current pain or pressure. Sx seem to be worse w/ eating but he's also noticed some worsening of his pressure with exertion. Earlier today, he felt like he had worsening pressure along with breaking out in a cold sweat, which is what brought him in to the ED. No SOB currently. No nausea, vomiting. Denies known h/o CAD. No specific alleviating factors.       Past Medical History:  Diagnosis Date  . Hypertension     Patient Active Problem List   Diagnosis Date Noted  . Elevated troponin 06/02/2020  . NSTEMI (non-ST elevated myocardial infarction) (HCC) 06/02/2020  . HTN (hypertension) 06/02/2020    Past Surgical History:  Procedure Laterality Date  . APPENDECTOMY      Prior to Admission medications   Medication Sig Start Date End Date Taking? Authorizing Provider  lisinopril (PRINIVIL,ZESTRIL) 5 MG tablet Take 1 tablet (5 mg total) by mouth daily. 09/13/16  Yes Renford Dills, NP  predniSONE (DELTASONE) 10 MG tablet Start 60 mg po day one, then 50 mg po day two, taper by 10 mg daily until complete. 09/13/16  Yes Renford Dills, NP  oxyCODONE-acetaminophen (ROXICET) 5-325 MG tablet Take 1 tablet by mouth every 8 (eight) hours as needed for moderate pain or severe pain (Do not drive or operate heavy machinery while taking as can cause  drowsiness.). Patient not taking: Reported on 06/02/2020 09/13/16   Renford Dills, NP    Allergies Penicillins  Family History  Problem Relation Age of Onset  . Stroke Father     Social History Social History   Tobacco Use  . Smoking status: Never Smoker  . Smokeless tobacco: Never Used  Substance Use Topics  . Alcohol use: No    Review of Systems  Review of Systems  Constitutional: Positive for fatigue. Negative for chills and fever.  HENT: Negative for sore throat.   Respiratory: Positive for chest tightness and shortness of breath.   Cardiovascular: Positive for chest pain.  Gastrointestinal: Negative for abdominal pain.  Genitourinary: Negative for flank pain.  Musculoskeletal: Negative for neck pain.  Skin: Negative for rash and wound.  Allergic/Immunologic: Negative for immunocompromised state.  Neurological: Negative for weakness and numbness.  Hematological: Does not bruise/bleed easily.  All other systems reviewed and are negative.    ____________________________________________  PHYSICAL EXAM:      VITAL SIGNS: ED Triage Vitals [06/02/20 0949]  Enc Vitals Group     BP (!) 196/110     Pulse Rate 79     Resp 17     Temp 98.5 F (36.9 C)     Temp Source Oral     SpO2 97 %     Weight 159 lb (72.1 kg)     Height 5\' 4"  (1.626 m)     Head Circumference  Peak Flow      Pain Score 0     Pain Loc      Pain Edu?      Excl. in GC?      Physical Exam Vitals and nursing note reviewed.  Constitutional:      General: He is not in acute distress.    Appearance: He is well-developed.  HENT:     Head: Normocephalic and atraumatic.  Eyes:     Conjunctiva/sclera: Conjunctivae normal.  Cardiovascular:     Rate and Rhythm: Normal rate and regular rhythm.     Heart sounds: Normal heart sounds. No murmur heard. No friction rub.  Pulmonary:     Effort: Pulmonary effort is normal. No respiratory distress.     Breath sounds: Normal breath sounds. No  wheezing or rales.  Abdominal:     General: There is no distension.     Palpations: Abdomen is soft.     Tenderness: There is no abdominal tenderness.  Musculoskeletal:     Cervical back: Neck supple.  Skin:    General: Skin is warm.     Capillary Refill: Capillary refill takes less than 2 seconds.  Neurological:     Mental Status: He is alert and oriented to person, place, and time.     Motor: No abnormal muscle tone.       ____________________________________________   LABS (all labs ordered are listed, but only abnormal results are displayed)  Labs Reviewed  COMPREHENSIVE METABOLIC PANEL - Abnormal; Notable for the following components:      Result Value   Potassium 3.0 (*)    Glucose, Bld 146 (*)    Creatinine, Ser 1.44 (*)    Total Protein 8.3 (*)    Alkaline Phosphatase 32 (*)    GFR, Estimated 57 (*)    All other components within normal limits  CBC - Abnormal; Notable for the following components:   MCH 34.2 (*)    MCHC 36.6 (*)    All other components within normal limits  TROPONIN I (HIGH SENSITIVITY) - Abnormal; Notable for the following components:   Troponin I (High Sensitivity) 3,106 (*)    All other components within normal limits  TROPONIN I (HIGH SENSITIVITY) - Abnormal; Notable for the following components:   Troponin I (High Sensitivity) 4,435 (*)    All other components within normal limits  RESP PANEL BY RT-PCR (FLU A&B, COVID) ARPGX2  LIPASE, BLOOD  URINALYSIS, COMPLETE (UACMP) WITH MICROSCOPIC  HIV ANTIBODY (ROUTINE TESTING W REFLEX)  LIPID PANEL  CBC  BASIC METABOLIC PANEL    ____________________________________________  EKG: Normal sinus rhythm, VR 78. PR 148, QRS 82, QTc 433. No acute St elevations. ST depressions noted in inferior and lateral precordial leads. ________________________________________  RADIOLOGY All imaging, including plain films, CT scans, and ultrasounds, independently reviewed by me, and interpretations confirmed  via formal radiology reads.  ED MD interpretation:   CXR: Clear  Official radiology report(s): DG Chest Portable 1 View  Result Date: 06/02/2020 CLINICAL DATA:  Chest pain EXAM: PORTABLE CHEST 1 VIEW COMPARISON:  None. FINDINGS: 1132 hours. The lungs are clear without focal pneumonia, edema, pneumothorax or pleural effusion. Mild vascular congestion. Cardiopericardial silhouette is at upper limits of normal for size. The visualized bony structures of the thorax show no acute abnormality. Telemetry leads overlie the chest. IMPRESSION: Mild vascular congestion. Electronically Signed   By: Kennith CenterEric  Mansell M.D.   On: 06/02/2020 11:44    ____________________________________________  PROCEDURES   Procedure(s) performed (  including Critical Care):  .1-3 Lead EKG Interpretation Performed by: Shaune Pollack, MD Authorized by: Shaune Pollack, MD     Interpretation: normal     ECG rate:  60-80   ECG rate assessment: normal     Rhythm: sinus rhythm     Ectopy: none     Conduction: normal   Comments:     Indication: chest pain .Critical Care Performed by: Shaune Pollack, MD Authorized by: Shaune Pollack, MD   Critical care provider statement:    Critical care time (minutes):  35   Critical care time was exclusive of:  Separately billable procedures and treating other patients and teaching time   Critical care was necessary to treat or prevent imminent or life-threatening deterioration of the following conditions:  Circulatory failure, cardiac failure and respiratory failure   Critical care was time spent personally by me on the following activities:  Development of treatment plan with patient or surrogate, discussions with consultants, evaluation of patient's response to treatment, examination of patient, obtaining history from patient or surrogate, ordering and performing treatments and interventions, ordering and review of laboratory studies, ordering and review of radiographic studies,  pulse oximetry, re-evaluation of patient's condition and review of old charts   I assumed direction of critical care for this patient from another provider in my specialty: no      ____________________________________________  INITIAL IMPRESSION / MDM / ASSESSMENT AND PLAN / ED COURSE  As part of my medical decision making, I reviewed the following data within the electronic MEDICAL RECORD NUMBER Nursing notes reviewed and incorporated, Old chart reviewed, Notes from prior ED visits, and [place] Controlled Substance Database       *Manuel Medina was evaluated in Emergency Department on 06/02/2020 for the symptoms described in the history of present illness. He was evaluated in the context of the global COVID-19 pandemic, which necessitated consideration that the patient might be at risk for infection with the SARS-CoV-2 virus that causes COVID-19. Institutional protocols and algorithms that pertain to the evaluation of patients at risk for COVID-19 are in a state of rapid change based on information released by regulatory bodies including the CDC and federal and state organizations. These policies and algorithms were followed during the patient's care in the ED.  Some ED evaluations and interventions may be delayed as a result of limited staffing during the pandemic.*     Medical Decision Making:  59 yo M here with SOB, chest pressure. On arrival, VSS but markedly hypertensive. EKG shows inferolateral ST depressions but no ST elevations. CBC without leukocytosis or anemia. CMP with hypokalemia, possible CKD. Trop markedly elevated, 3106. CXR reviewed by me and is clear. Will start on heparin, admit. ASA given.   ____________________________________________  FINAL CLINICAL IMPRESSION(S) / ED DIAGNOSES  Final diagnoses:  NSTEMI (non-ST elevated myocardial infarction) (HCC)     MEDICATIONS GIVEN DURING THIS VISIT:  Medications  nitroGLYCERIN (NITROSTAT) SL tablet 0.4 mg ( Sublingual MAR Unhold  06/02/20 1715)  nitroGLYCERIN (NITROSTAT) SL tablet 0.4 mg ( Sublingual MAR Unhold 06/02/20 1715)  acetaminophen (TYLENOL) tablet 650 mg ( Oral MAR Unhold 06/02/20 1715)  ondansetron (ZOFRAN) injection 4 mg ( Intravenous MAR Unhold 06/02/20 1715)  atorvastatin (LIPITOR) tablet 40 mg ( Oral MAR Unhold 06/02/20 1715)  sodium chloride flush (NS) 0.9 % injection 3 mL ( Intravenous MAR Unhold 06/02/20 1715)  potassium chloride 10 mEq in 100 mL IVPB ( Intravenous MAR Unhold 06/02/20 1715)  hydrALAZINE (APRESOLINE) injection 10 mg ( Intravenous MAR  Unhold 06/02/20 1715)  0.9 %  sodium chloride infusion ( Intravenous New Bag/Given 06/02/20 1326)  labetalol (NORMODYNE) injection 10 mg (has no administration in time range)  hydrALAZINE (APRESOLINE) injection 10 mg (has no administration in time range)  acetaminophen (TYLENOL) tablet 650 mg (has no administration in time range)  ondansetron (ZOFRAN) injection 4 mg (has no administration in time range)  0.9% sodium chloride infusion (1 mL/kg/hr  72.1 kg Intravenous New Bag/Given 06/02/20 1723)  sodium chloride flush (NS) 0.9 % injection 3 mL (has no administration in time range)  sodium chloride flush (NS) 0.9 % injection 3 mL (has no administration in time range)  0.9 %  sodium chloride infusion (has no administration in time range)  aspirin chewable tablet 81 mg (has no administration in time range)  heparin ADULT infusion 100 units/mL (25000 units/261mL sodium chloride 0.45%) (has no administration in time range)  morphine 4 MG/ML injection 4 mg (4 mg Intravenous Given 06/02/20 1131)  ondansetron (ZOFRAN) injection 4 mg (4 mg Intravenous Given 06/02/20 1133)  heparin bolus via infusion 4,000 Units (4,000 Units Intravenous Bolus from Bag 06/02/20 1137)  labetalol (NORMODYNE) injection 10 mg (10 mg Intravenous Given 06/02/20 1152)  sodium chloride 0.9 % bolus 500 mL (500 mLs Intravenous Bolus from Bag 06/02/20 1326)     ED Discharge Orders    None        Note:  This document was prepared using Dragon voice recognition software and may include unintentional dictation errors.   Shaune Pollack, MD 06/02/20 [phone]

## 2020-06-02 NOTE — ED Triage Notes (Signed)
Pt c/o  Epigastric pain/pressure for the past week and was seen PCP and rx GERD med with minimal relief, states he went today and his b/p was elevated and sent here for further treatment, pt arrives with 2 inches of nitro  paste

## 2020-06-02 NOTE — Consult Note (Signed)
CARDIOLOGY CONSULT NOTE               Patient ID: Manuel Medina MRN: [id number] DOB/AGE: 57-Jul-1962 57 y.o.  Admit date: 06/02/2020 Referring Physician Dr. Lucianne Muss hospitalist Primary Physician Dr Lucie Leather primary care Primary Cardiologist [facility] Reason for Consultation non-STEMI acute coronary syndrome elevated troponins  HPI: Patient is a 59 year old presented with substernal chest pain symptoms that he thought was reflux but he broke out in a sweat early this morning around 730.  Patient went to work felt poorly so the plan he was found to be hypertensive urgency blood pressures of 200 with waxing and waning chest discomfort.  Patient was brought to the emergency room was found to have diffuse ST segment depression initial troponins were elevated at 3000 so the patient was placed on heparin aspirin and treated as a non-STEMI and cardiology consultation was done recommended the patient was pain-free in the emergency room blood pressure other vital signs were stable no previous cardiac history.  Patient states has been having symptoms for close to 12 months and was treated for reflux on different medications sometimes he had pain after he ate sometimes it was not related to eating but mostly with exertion.  He has not had any previous cardiac work-up does not smoke denies diabetes no significant alcohol consumption.  Is on no medications except reflux meds  Review of systems complete and found to be negative unless listed above     Past Medical History:  Diagnosis Date  . Hypertension     Past Surgical History:  Procedure Laterality Date  . APPENDECTOMY      Medications Prior to Admission  Medication Sig Dispense Refill Last Dose  . lisinopril (PRINIVIL,ZESTRIL) 5 MG tablet Take 1 tablet (5 mg total) by mouth daily. 21 tablet 0 06/01/2020 at Unknown time  . predniSONE (DELTASONE) 10 MG tablet Start 60 mg po day one, then 50 mg po day two, taper by 10 mg daily until  complete. 21 tablet 0 06/01/2020 at Unknown time  . oxyCODONE-acetaminophen (ROXICET) 5-325 MG tablet Take 1 tablet by mouth every 8 (eight) hours as needed for moderate pain or severe pain (Do not drive or operate heavy machinery while taking as can cause drowsiness.). (Patient not taking: Reported on 06/02/2020) 9 tablet 0 Not Taking at Unknown time   Social History   Socioeconomic History  . Marital status: Significant Other    Spouse name: Not on file  . Number of children: 1  . Years of education: Not on file  . Highest education level: Not on file  Occupational History  . Not on file  Tobacco Use  . Smoking status: Never Smoker  . Smokeless tobacco: Never Used  Substance and Sexual Activity  . Alcohol use: No  . Drug use: Not on file  . Sexual activity: Not on file  Other Topics Concern  . Not on file  Social History Narrative  . Not on file   Social Determinants of Health   Financial Resource Strain: Not on file  Food Insecurity: Not on file  Transportation Needs: Not on file  Physical Activity: Not on file  Stress: Not on file  Social Connections: Not on file  Intimate Partner Violence: Not on file    Family History  Problem Relation Age of Onset  . Stroke Father       Review of systems complete and found to be negative unless listed above      PHYSICAL EXAM  General: Well developed, well nourished, in no acute distress HEENT:  Normocephalic and atramatic Neck:  No JVD.  Lungs: Clear bilaterally to auscultation and percussion. Heart: HRRR . Normal S1 and S2 without gallops or murmurs.  Abdomen: Bowel sounds are positive, abdomen soft and non-tender  Msk:  Back normal, normal gait. Normal strength and tone for age. Extremities: No clubbing, cyanosis or edema.   Neuro: Alert and oriented X 3. Psych:  Good affect, responds appropriately  Labs:   Lab Results  Component Value Date   WBC 6.9 06/02/2020   HGB 16.6 06/02/2020   HCT 45.4 06/02/2020    MCV 93.6 06/02/2020   PLT 214 06/02/2020    Recent Labs  Lab 06/02/20 0952  NA 141  K 3.0*  CL 105  CO2 24  BUN 18  CREATININE 1.44*  CALCIUM 9.1  PROT 8.3*  BILITOT 0.9  ALKPHOS 32*  ALT 37  AST 41  GLUCOSE 146*   No results found for: CKTOTAL, CKMB, CKMBINDEX, TROPONINI No results found for: CHOL No results found for: HDL No results found for: LDLCALC No results found for: TRIG No results found for: CHOLHDL No results found for: LDLDIRECT    Radiology: CARDIAC CATHETERIZATION  Result Date: 06/02/2020  Mid RCA lesion is 90% stenosed.  Prox LAD to Mid LAD lesion is 95% stenosed.  2nd Diag lesion is 75% stenosed.  Mid LAD lesion is 50% stenosed.  Ost Cx to Prox Cx lesion is 99% stenosed.  Prox Cx to Mid Cx lesion is 75% stenosed.  Faint collaterals right left  Low normal left ventricular function of around 50%  Conclusion Low normal left ventricular function EF of 50% Normal left main High-grade lesion in LAD proximal to mid 95% High-grade lesion in proximal circumflex 99% High-grade lesion in mid RCA 90% Intervention deferred Patient referred for coronary bypass surgery at a tertiary care center   DG Chest Portable 1 View  Result Date: 06/02/2020 CLINICAL DATA:  Chest pain EXAM: PORTABLE CHEST 1 VIEW COMPARISON:  None. FINDINGS: 1132 hours. The lungs are clear without focal pneumonia, edema, pneumothorax or pleural effusion. Mild vascular congestion. Cardiopericardial silhouette is at upper limits of normal for size. The visualized bony structures of the thorax show no acute abnormality. Telemetry leads overlie the chest. IMPRESSION: Mild vascular congestion. Electronically Signed   By: Kennith Center M.D.   On: 06/02/2020 11:44    EKG: Normal sinus rhythm diffuse ST segment depression rate of 75  ASSESSMENT AND PLAN:  Non-STEMI Acute coronary syndrome Elevated troponin Abnormal EKG Hypertension GERD Renal insufficiency Hypokalemia . Plan Agree with admit to  telemetry Commend aspirin 81 mg daily Correct electrolytes include hypokalemia Recommend hemoglobin A1c for assessment of possible diabetes Okay to continue omeprazole or Protonix for presumed reflux symptom Consider statin therapy Lipitor or Crestor IV heparin intravenously for anticoagulation Echocardiogram for assessment of left ventricular function Low-dose beta blocker and ARB or ACE inhibitor Hydration for renal insufficiency Add nitrates if patient has further angina Recommend invasive strategy possible cardiac cath today   Signed: Alwyn Pea MD 06/02/2020, 6:38 PM

## 2020-06-02 NOTE — Consult Note (Signed)
ANTICOAGULATION CONSULT NOTE - Consult  Pharmacy Consult for Heparin gtt Indication: chest pain/ACS  Allergies  Allergen Reactions  . Penicillins Rash    Patient Measurements: Height: 5\' 4"  (162.6 cm) Weight: 72.1 kg (159 lb) IBW/kg (Calculated) : 59.2 Heparin Dosing Weight: 72.1 kg  Vital Signs: Temp: 98.5 F (36.9 C) (12/13 0949) Temp Source: Oral (12/13 0949) BP: 192/109 (12/13 1112) Pulse Rate: 85 (12/13 1112)  Labs: Recent Labs    06/02/20 0952  HGB 16.6  HCT 45.4  PLT 214  CREATININE 1.44*  TROPONINIHS 3,106*    Estimated Creatinine Clearance: 51.6 mL/min (A) (by C-G formula based on SCr of 1.44 mg/dL (H)).   Medications:  PTA - ASA 81mg   Assessment: No current interacting inpatient medications. Intake Med rec incomplete, however,  Per care everywhere, ASA 81mg  QD. No AC pertinent drug allergy.  Goal of Therapy:  Heparin level 0.3-0.7 units/ml Monitor platelets by anticoagulation protocol: Yes   Plan:  Give 4000 units bolus x 1 Start heparin infusion at 850 units/hr Check anti-Xa level in 6 hours and daily while on heparin Continue to monitor H&H and platelets  06/04/20 D Soma Lizak 06/02/2020,11:14 AM

## 2020-06-02 NOTE — ED Notes (Signed)
Dr. Juliann Pares, MD cardiology at bedside at this time.

## 2020-06-02 NOTE — H&P (Signed)
History and Physical    Manuel Medina ZOX:[id number] DOB: April 26, 1961 DOA: 06/02/2020  PCP: Patient, No Pcp Per   Patient coming from:  Home.  I have personally briefly reviewed patient's old medical records in [facility].  Chief Complaint:  Chest pain  X 3 weeks.  HPI: Manuel Medina is a 60 y.o. male with medical history significant of hypertension, GERD presents in the emergency department with intermittent chest pain and pressure for 3 weeks.  Patient reports having substernal chest pain that started 3 weeks ago which was getting worse with food,  he thought it could be related to worsening GERD,  he went to see his primary care physician who has prescribed him some antacid which has helped his pain significantly but then he developed chest pressure which has been getting worse.  Chest pain was not related to exertion.  He has profuse sweating today morning after he woke up,  denied shortness of breath,  dizziness and palpitation.  ED Course: Patient was hemodynamically stable except elevated blood pressure. Labs include: Sodium 141, potassium 3.0, chloride 105, bicarb 24, glucose 146, BUN 18, creatinine 1.44, calcium 9.1, anion gap 12, albumin 4.0, lipase 39, AST 41, ALT 37, total protein 8.3, total bilirubin 0.9, WBC 6.9, hemoglobin 16.6, hematocrit 45.4, platelet 214, troponins 3106-4435, influenza negative, Covid negative, chest x-ray no acute abnormality.  Review of Systems: Review of Systems  Constitutional: Negative.   HENT: Negative.   Eyes: Negative.   Respiratory: Negative.   Cardiovascular: Positive for chest pain.  Gastrointestinal: Positive for heartburn and nausea.  Genitourinary: Negative.   Musculoskeletal: Negative.   Skin: Negative.   Neurological: Negative.   Endo/Heme/Allergies: Negative.   Psychiatric/Behavioral: Negative.      Past Medical History:  Diagnosis Date  . Hypertension     Past Surgical History:  Procedure Laterality Date  .  APPENDECTOMY       reports that he has never smoked. He has never used smokeless tobacco. He reports that he does not drink alcohol. No history on file for drug use.  Allergies  Allergen Reactions  . Penicillins Rash    Family History  Problem Relation Age of Onset  . Stroke Father     Family history reviewed and not pertinent   Prior to Admission medications   Medication Sig Start Date End Date Taking? Authorizing Provider  lisinopril (PRINIVIL,ZESTRIL) 5 MG tablet Take 1 tablet (5 mg total) by mouth daily. 09/13/16   Renford Dills, NP  oxyCODONE-acetaminophen (ROXICET) 5-325 MG tablet Take 1 tablet by mouth every 8 (eight) hours as needed for moderate pain or severe pain (Do not drive or operate heavy machinery while taking as can cause drowsiness.). 09/13/16   Renford Dills, NP  predniSONE (DELTASONE) 10 MG tablet Start 60 mg po day one, then 50 mg po day two, taper by 10 mg daily until complete. 09/13/16   Renford Dills, NP    Physical Exam: Vitals:   06/02/20 0949 06/02/20 1112 06/02/20 1130 06/02/20 1200  BP: (!) 196/110 (!) 192/109 (!) 165/113 (!) 147/98  Pulse: 79 85 80 70  Resp: 17 (!) 21 16 17   Temp: 98.5 F (36.9 C)     TempSrc: Oral     SpO2: 97% 98% 97% 95%  Weight: 72.1 kg     Height: 5\' 4"  (1.626 m)       Constitutional: NAD, calm, comfortable.  Denies chest pain at present. Vitals:   06/02/20 06/02/20 1112 06/02/20 1130 06/02/20  1200  BP: (!) 196/110 (!) 192/109 (!) 165/113 (!) 147/98  Pulse: 79 85 80 70  Resp: 17 (!) 21 16 17   Temp: 98.5 F (36.9 C)     TempSrc: Oral     SpO2: 97% 98% 97% 95%  Weight: 72.1 kg     Height: 5\' 4"  (1.626 m)      Eyes: PERRL, lids and conjunctivae normal. ENMT: Mucous membranes are moist. Posterior pharynx clear of any exudate or lesions.Normal dentition.  Neck: normal, supple, no masses, no thyromegaly Respiratory: clear to auscultation bilaterally, no wheezing, no crackles. Normal respiratory effort. No  accessory muscle use.  Cardiovascular: Regular rate and rhythm, no murmurs / rubs / gallops. No extremity edema. 2+ pedal pulses. No carotid bruits.  Abdomen: no tenderness, no masses palpated. No hepatosplenomegaly. Bowel sounds positive.  Musculoskeletal: no clubbing / cyanosis. No joint deformity upper and lower extremities. Good ROM, no contractures. Normal muscle tone.  Skin: no rashes, lesions, ulcers. No induration Neurologic: CN 2-12 grossly intact. Sensation intact, DTR normal. Strength 5/5 in all 4.  Psychiatric: Normal judgment and insight. Alert and oriented x 3. Normal mood.    Labs on Admission: I have personally reviewed following labs and imaging studies  CBC: Recent Labs  Lab 06/02/20 0952  WBC 6.9  HGB 16.6  HCT 45.4  MCV 93.6  PLT 214   Basic Metabolic Panel: Recent Labs  Lab 06/02/20 0952  NA 141  K 3.0*  CL 105  CO2 24  GLUCOSE 146*  BUN 18  CREATININE 1.44*  CALCIUM 9.1   GFR: Estimated Creatinine Clearance: 51.6 mL/min (A) (by C-G formula based on SCr of 1.44 mg/dL (H)). Liver Function Tests: Recent Labs  Lab 06/02/20 0952  AST 41  ALT 37  ALKPHOS 32*  BILITOT 0.9  PROT 8.3*  ALBUMIN 4.0   Recent Labs  Lab 06/02/20 0952  LIPASE 39   No results for input(s): AMMONIA in the last 168 hours. Coagulation Profile: No results for input(s): INR, PROTIME in the last 168 hours. Cardiac Enzymes: No results for input(s): CKTOTAL, CKMB, CKMBINDEX, TROPONINI in the last 168 hours. BNP (last 3 results) No results for input(s): PROBNP in the last 8760 hours. HbA1C: No results for input(s): HGBA1C in the last 72 hours. CBG: No results for input(s): GLUCAP in the last 168 hours. Lipid Profile: No results for input(s): CHOL, HDL, LDLCALC, TRIG, CHOLHDL, LDLDIRECT in the last 72 hours. Thyroid Function Tests: No results for input(s): TSH, T4TOTAL, FREET4, T3FREE, THYROIDAB in the last 72 hours. Anemia Panel: No results for input(s): VITAMINB12,  FOLATE, FERRITIN, TIBC, IRON, RETICCTPCT in the last 72 hours. Urine analysis:    Component Value Date/Time   COLORURINE Yellow 03/18/2013 0950   APPEARANCEUR Clear 03/18/2013 0950   LABSPEC 1.012 03/18/2013 0950   PHURINE 5.0 03/18/2013 0950   GLUCOSEU Negative 03/18/2013 0950   HGBUR 2+ 03/18/2013 0950   BILIRUBINUR Negative 03/18/2013 0950   KETONESUR Negative 03/18/2013 0950   PROTEINUR Negative 03/18/2013 0950   NITRITE Negative 03/18/2013 0950   LEUKOCYTESUR Negative 03/18/2013 0950    Radiological Exams on Admission: DG Chest Portable 1 View  Result Date: 06/02/2020 CLINICAL DATA:  Chest pain EXAM: PORTABLE CHEST 1 VIEW COMPARISON:  None. FINDINGS: 1132 hours. The lungs are clear without focal pneumonia, edema, pneumothorax or pleural effusion. Mild vascular congestion. Cardiopericardial silhouette is at upper limits of normal for size. The visualized bony structures of the thorax show no acute abnormality. Telemetry leads overlie the  chest. IMPRESSION: Mild vascular congestion. Electronically Signed   By: Kennith Center M.D.   On: 06/02/2020 11:44    EKG: Independently reviewed.  Normal sinus rhythm, ST-T wave depression.  Assessment/Plan Active Problems:   Elevated troponin   NSTEMI (non-ST elevated myocardial infarction) (HCC)   HTN (hypertension)   Chest pain secondary to NSTEMI: Patient presents with intermittent chest pressure and pain for 3 weeks. Chest pain gotten worse today associated with profuse sweating and nausea. Patient was given aspirin 325 on arrival, EKG shows diffuse ST depression. Elevated troponins 3106 - 4435 ,  trending up. Admit to progressive unit. Patient started on heparin drip, cardiology consulted. Dr. Juliann Pares has seen the patient,  recommended left heart cath today. Keep patient n.p.o., IV fluids.  Acute kidney injury could be secondary to dehydration. Continue gentle IV hydration, avoid nephrotoxic medications. Recheck labs tomorrow  morning  Hypokalemia: Potassium replacement in process Recheck labs tomorrow morning.  Hypertension: We will hold lisinopril due to increased renal functions. Hydralazine prn    DVT prophylaxis: Heparin gtt Code Status: Full code. Family Communication: No family at bed side. Disposition Plan: Home Consults called: [facility]. Admission status: Inpatient   Cipriano Bunker MD [facility]   If 7PM-7AM, please contact night-coverage [email or web address]   06/02/2020, 12:35 PM

## 2020-06-03 ENCOUNTER — Inpatient Hospital Stay
Admit: 2020-06-03 | Discharge: 2020-06-03 | Disposition: A | Discharge status: Home / Self Care | Payer: Self-pay | Attending: Family Medicine | Admitting: Family Medicine

## 2020-06-03 ENCOUNTER — Encounter: Payer: Self-pay | Admitting: Internal Medicine

## 2020-06-03 DIAGNOSIS — R079 Chest pain, unspecified: Secondary | ICD-10-CM

## 2020-06-03 LAB — ECHOCARDIOGRAM COMPLETE
AR max vel: 2.75 cm2
AV Area VTI: 2.49 cm2
AV Area mean vel: 2.45 cm2
AV Mean grad: 2 mmHg
AV Peak grad: 3.7 mmHg
Ao pk vel: 0.97 m/s
Area-P 1/2: 6.17 cm2
Height: 64 in
S' Lateral: 3.19 cm
Weight: 2614.4 oz

## 2020-06-03 LAB — CBC
HCT: 36 % — ABNORMAL LOW (ref 39.0–52.0)
Hemoglobin: 12.9 g/dL — ABNORMAL LOW (ref 13.0–17.0)
MCH: 34.1 pg — ABNORMAL HIGH (ref 26.0–34.0)
MCHC: 35.8 g/dL (ref 30.0–36.0)
MCV: 95.2 fL (ref 80.0–100.0)
Platelets: 192 10*3/uL (ref 150–400)
RBC: 3.78 MIL/uL — ABNORMAL LOW (ref 4.22–5.81)
RDW: 11.8 % (ref 11.5–15.5)
WBC: 7.8 10*3/uL (ref 4.0–10.5)
nRBC: 0 % (ref 0.0–0.2)

## 2020-06-03 LAB — BASIC METABOLIC PANEL
Anion gap: 8 (ref 5–15)
BUN: 15 mg/dL (ref 6–20)
CO2: 26 mmol/L (ref 22–32)
Calcium: 8.3 mg/dL — ABNORMAL LOW (ref 8.9–10.3)
Chloride: 106 mmol/L (ref 98–111)
Creatinine, Ser: 1.48 mg/dL — ABNORMAL HIGH (ref 0.61–1.24)
GFR, Estimated: 55 mL/min — ABNORMAL LOW (ref >60)
Glucose, Bld: 95 mg/dL (ref 70–99)
Potassium: 3.4 mmol/L — ABNORMAL LOW (ref 3.5–5.1)
Sodium: 140 mmol/L (ref 135–145)

## 2020-06-03 LAB — LIPID PANEL
Cholesterol: 130 mg/dL (ref 0–200)
HDL: 26 mg/dL — ABNORMAL LOW (ref >40)
LDL Cholesterol: 84 mg/dL (ref 0–99)
Total CHOL/HDL Ratio: 5 RATIO
Triglycerides: 100 mg/dL (ref <150)
VLDL: 20 mg/dL (ref 0–40)

## 2020-06-03 LAB — HEPARIN LEVEL (UNFRACTIONATED)
Heparin Unfractionated: 0.1 IU/mL — ABNORMAL LOW (ref 0.30–0.70)
Heparin Unfractionated: 0.42 IU/mL (ref 0.30–0.70)

## 2020-06-03 LAB — GLUCOSE, CAPILLARY: Glucose-Capillary: 87 mg/dL (ref 70–99)

## 2020-06-03 LAB — HIV ANTIBODY (ROUTINE TESTING W REFLEX): HIV Screen 4th Generation wRfx: NONREACTIVE

## 2020-06-03 LAB — MAGNESIUM: Magnesium: 2.1 mg/dL (ref 1.7–2.4)

## 2020-06-03 MED ORDER — LOSARTAN POTASSIUM 25 MG PO TABS
25.0000 mg | ORAL_TABLET | Freq: Every day | ORAL | Status: DC
  Administered 2020-06-03: 11:00:00 25 mg via ORAL
  Filled 2020-06-03: qty 1

## 2020-06-03 MED ORDER — HEPARIN BOLUS VIA INFUSION
2200.0000 [IU] | Freq: Once | INTRAVENOUS | Status: AC
  Administered 2020-06-03: 17:00:00 2200 [IU] via INTRAVENOUS
  Filled 2020-06-03: qty 2200

## 2020-06-03 MED ORDER — POTASSIUM CHLORIDE CRYS ER 20 MEQ PO TBCR
40.0000 meq | EXTENDED_RELEASE_TABLET | Freq: Once | ORAL | Status: AC
  Administered 2020-06-03: 17:00:00 40 meq via ORAL
  Filled 2020-06-03: qty 2

## 2020-06-03 MED ORDER — METOPROLOL TARTRATE 25 MG PO TABS
12.5000 mg | ORAL_TABLET | Freq: Two times a day (BID) | ORAL | Status: DC
  Administered 2020-06-03 (×2): 12.5 mg via ORAL
  Filled 2020-06-03 (×2): qty 1

## 2020-06-03 MED ORDER — PERFLUTREN LIPID MICROSPHERE
1.0000 mL | INTRAVENOUS | Status: AC | PRN
  Administered 2020-06-03: 09:00:00 2 mL via INTRAVENOUS
  Filled 2020-06-03: qty 10

## 2020-06-03 MED ORDER — HEPARIN (PORCINE) 25000 UT/250ML-% IV SOLN
1100.0000 [IU]/h | INTRAVENOUS | Status: DC
  Administered 2020-06-03: 08:00:00 850 [IU]/h via INTRAVENOUS
  Filled 2020-06-03: qty 250

## 2020-06-03 MED ORDER — ATORVASTATIN CALCIUM 80 MG PO TABS: 80.0000 mg | ORAL_TABLET | Freq: Every day | ORAL | Status: DC

## 2020-06-03 MED ORDER — HEPARIN (PORCINE) 25000 UT/250ML-% IV SOLN: 850.0000 [IU]/h | INTRAVENOUS | Status: DC

## 2020-06-03 NOTE — Consult Note (Signed)
ANTICOAGULATION CONSULT NOTE - Consult  Pharmacy Consult for Heparin gtt Indication: chest pain/ACS  Allergies  Allergen Reactions  . Penicillins Rash    Patient Measurements: Height: 5\' 4"  (162.6 cm) Weight: 74.1 kg (163 lb 6.4 oz) IBW/kg (Calculated) : 59.2 Heparin Dosing Weight: 72.1 kg  Vital Signs: Temp: 98.1 F (36.7 C) (12/14 1218) Temp Source: Oral (12/14 1218) BP: 164/101 (12/14 1218) Pulse Rate: 85 (12/14 1218)  Labs: Recent Labs    06/02/20 0952 06/02/20 1127 06/02/20 2038 06/03/20 0428 06/03/20 1449  HGB 16.6  --   --  12.9*  --   HCT 45.4  --   --  36.0*  --   PLT 214  --   --  192  --   HEPARINUNFRC  --   --   --   --  0.10*  CREATININE 1.44*  --  1.38* 1.48*  --   TROPONINIHS 3,106* 4,435*  --   --   --     Estimated Creatinine Clearance: 50.8 mL/min (A) (by C-G formula based on SCr of 1.48 mg/dL (H)).   Medications:  PTA - ASA 81mg , Prednisone 10mg  QD  Assessment: 59 year old male presenting with chest pain x 3 weeks. PMH includes HTN and GERD. Pt underwent cath procedure with sheath removal @ 1554. Heparin was not restarted 12/13 @ 2330  as planned. Restarting heparin gtt ASAP at prior rate of 850units/hr. No current interacting inpatient medications. No AC pertinent drug allergy. Trops 4435. Hgb 12.9, plt WNL.  12/14: heparin restarted following cath. HL @ 1449 0.10  Goal of Therapy:  Heparin level 0.3-0.7 units/ml Monitor platelets by anticoagulation protocol: Yes   Plan:  - 12/14 @ 1449 HL 0.10 - give heparin bolus of 2200 units - Increase heparin rate to 1100 units/hr - Check anti-Xa level in 6 hours and daily while on heparin - Continue to monitor H&H and platelets with AM labs  1/14, PharmD Pharmacy Resident  06/03/2020 3:36 PM

## 2020-06-03 NOTE — Progress Notes (Signed)
PROGRESS NOTE    Manuel Medina  RWE:[id number] DOB: 1961-04-22 DOA: 06/02/2020 PCP: Patient, No Pcp Per  Outpatient Specialists: none    Brief Narrative:   Manuel Medina is a 59 y.o. male with medical history significant of hypertension, GERD presents in the emergency department with intermittent chest pain and pressure for 3 weeks.  Patient reports having substernal chest pain that started 3 weeks ago which was getting worse with food,  he thought it could be related to worsening GERD,  he went to see his primary care physician who has prescribed him some antacid which has helped his pain significantly but then he developed chest pressure which has been getting worse.  Chest pain was not related to exertion.  He has profuse sweating today morning after he woke up,  denied shortness of breath,  dizziness and palpitation.   Assessment & Plan:   Active Problems:   Elevated troponin   NSTEMI (non-ST elevated myocardial infarction) (HCC)   HTN (hypertension)   Chest pain secondary to NSTEMI: Patient presents with intermittent chest pressure and pain for 3 weeks. Chest pain gotten worse today associated with profuse sweating and nausea. Patient was given aspirin 325 on arrival, EKG shows diffuse ST depression. Elevated troponins 3106 - 4435 Started on heparin gtt. LHC shows 3-vessel disease, no PCI. Patient started on heparin drip, cardiology consulted. Plan is continue heparin, transfer to outside hospital for CABG. Dr. Juliann Pares has spoken w/ Dr. Nance Pew at [facility] and patient has been accepted there, currently awaiting bed. Also continuing aspirin, metoprolol, losartan, and atorvastatin. Nitro prn  Acute kidney injury could be secondary to dehydration. Vs ckd as no recent creatinines to compare to and did have elevated creatinine of 1.2 several years ago. Here does not appear volume down and cr 1.3-1.4 - cont metoprolol, losartan  Hypokalemia: K 3.4 this morning, mg  pending - k 40, f/u mg  Hypertension: Here bp elevated - have started metoprolol and losartan   DVT prophylaxis: heparin Code Status: full Family Communication: none @ bedside  Status is: Inpatient  Remains inpatient appropriate because:Inpatient level of care appropriate due to severity of illness   Dispo: The patient is from: Home              Anticipated d/c is to: outside hospital              Anticipated d/c date is: 0-1 days              Patient currently is not medically stable to d/c.        Consultants:  cardiology  Procedures: Left heart cath  Antimicrobials:  none    Subjective: This morning no chest pain or sob. Has appetite, no n/v/d.   Objective: Vitals:   06/02/20 1918 06/03/20 0338 06/03/20 0805 06/03/20 1218  BP: 140/85 126/80 (!) 155/98 (!) 164/101  Pulse: 81 74 78 85  Resp: 18 18 19    Temp: 98.7 F (37.1 C) 98.2 F (36.8 C) 98.5 F (36.9 C) 98.1 F (36.7 C)  TempSrc: Oral Oral Oral Oral  SpO2: 96% 99% 100% 97%  Weight:  74.1 kg    Height:        Intake/Output Summary (Last 24 hours) at 06/03/2020 1518 Last data filed at 06/03/2020 1300 Gross per 24 hour  Intake 240 ml  Output 1 ml  Net 239 ml   Filed Weights   06/02/20 0949 06/02/20 1433 06/03/20 0338  Weight: 72.1 kg 72.1 kg 74.1  kg    Examination:  General exam: Appears calm and comfortable  Respiratory system: Clear to auscultation. Respiratory effort normal. Cardiovascular system: S1 & S2 heard, RRR. No JVD, murmurs, rubs, gallops or clicks. No pedal edema. Gastrointestinal system: Abdomen is nondistended, soft and nontender. No organomegaly or masses felt. Normal bowel sounds heard. Central nervous system: Alert and oriented. No focal neurological deficits. Extremities: Symmetric 5 x 5 power. Skin: No rashes, lesions or ulcers Psychiatry: Judgement and insight appear normal. Mood & affect appropriate.     Data Reviewed: I have personally reviewed following  labs and imaging studies  CBC: Recent Labs  Lab 06/02/20 0952 06/03/20 0428  WBC 6.9 7.8  HGB 16.6 12.9*  HCT 45.4 36.0*  MCV 93.6 95.2  PLT 214 192   Basic Metabolic Panel: Recent Labs  Lab 06/02/20 0952 06/02/20 2038 06/03/20 0428  NA 141 140 140  K 3.0* 3.2* 3.4*  CL 105 105 106  CO2 24 27 26   GLUCOSE 146* 96 95  BUN 18 17 15   CREATININE 1.44* 1.38* 1.48*  CALCIUM 9.1 8.1* 8.3*   GFR: Estimated Creatinine Clearance: 50.8 mL/min (A) (by C-G formula based on SCr of 1.48 mg/dL (H)). Liver Function Tests: Recent Labs  Lab 06/02/20 0952  AST 41  ALT 37  ALKPHOS 32*  BILITOT 0.9  PROT 8.3*  ALBUMIN 4.0   Recent Labs  Lab 06/02/20 0952  LIPASE 39   No results for input(s): AMMONIA in the last 168 hours. Coagulation Profile: No results for input(s): INR, PROTIME in the last 168 hours. Cardiac Enzymes: No results for input(s): CKTOTAL, CKMB, CKMBINDEX, TROPONINI in the last 168 hours. BNP (last 3 results) No results for input(s): PROBNP in the last 8760 hours. HbA1C: No results for input(s): HGBA1C in the last 72 hours. CBG: No results for input(s): GLUCAP in the last 168 hours. Lipid Profile: Recent Labs    06/03/20 0428  CHOL 130  HDL 26*  LDLCALC 84  TRIG 161100  CHOLHDL 5.0   Thyroid Function Tests: No results for input(s): TSH, T4TOTAL, FREET4, T3FREE, THYROIDAB in the last 72 hours. Anemia Panel: No results for input(s): VITAMINB12, FOLATE, FERRITIN, TIBC, IRON, RETICCTPCT in the last 72 hours. Urine analysis:    Component Value Date/Time   COLORURINE Yellow 03/18/2013 0950   APPEARANCEUR Clear 03/18/2013 0950   LABSPEC 1.012 03/18/2013 0950   PHURINE 5.0 03/18/2013 0950   GLUCOSEU Negative 03/18/2013 0950   HGBUR 2+ 03/18/2013 0950   BILIRUBINUR Negative 03/18/2013 0950   KETONESUR Negative 03/18/2013 0950   PROTEINUR Negative 03/18/2013 0950   NITRITE Negative 03/18/2013 0950   LEUKOCYTESUR Negative 03/18/2013 0950   Sepsis  Labs: @LABRCNTIP (procalcitonin:4,lacticidven:4)  ) Recent Results (from the past 240 hour(s))  Resp Panel by RT-PCR (Flu A&B, Covid) Nasopharyngeal Swab     Status: None   Collection Time: 06/02/20 11:17 AM   Specimen: Nasopharyngeal Swab; Nasopharyngeal(NP) swabs in vial transport medium  Result Value Ref Range Status   SARS Coronavirus 2 by RT PCR NEGATIVE NEGATIVE Final    Comment: (NOTE) SARS-CoV-2 target nucleic acids are NOT DETECTED.  The SARS-CoV-2 RNA is generally detectable in upper respiratory specimens during the acute phase of infection. The lowest concentration of SARS-CoV-2 viral copies this assay can detect is 138 copies/mL. A negative result does not preclude SARS-Cov-2 infection and should not be used as the sole basis for treatment or other patient management decisions. A negative result may occur with  improper specimen collection/handling, submission of  specimen other than nasopharyngeal swab, presence of viral mutation(s) within the areas targeted by this assay, and inadequate number of viral copies(<138 copies/mL). A negative result must be combined with clinical observations, patient history, and epidemiological information. The expected result is Negative.  Fact Sheet for Patients:  BloggerCourse.com  Fact Sheet for Healthcare Providers:  SeriousBroker.it  This test is no t yet approved or cleared by the [place] FDA and  has been authorized for detection and/or diagnosis of SARS-CoV-2 by FDA under an Emergency Use Authorization (EUA). This EUA will remain  in effect (meaning this test can be used) for the duration of the COVID-19 declaration under Section 564(b)(1) of the Act, 21 U.S.C.section 360bbb-3(b)(1), unless the authorization is terminated  or revoked sooner.       Influenza A by PCR NEGATIVE NEGATIVE Final   Influenza B by PCR NEGATIVE NEGATIVE Final    Comment: (NOTE) The Xpert  Xpress SARS-CoV-2/FLU/RSV plus assay is intended as an aid in the diagnosis of influenza from Nasopharyngeal swab specimens and should not be used as a sole basis for treatment. Nasal washings and aspirates are unacceptable for Xpert Xpress SARS-CoV-2/FLU/RSV testing.  Fact Sheet for Patients: BloggerCourse.com  Fact Sheet for Healthcare Providers: SeriousBroker.it  This test is not yet approved or cleared by the [place] FDA and has been authorized for detection and/or diagnosis of SARS-CoV-2 by FDA under an Emergency Use Authorization (EUA). This EUA will remain in effect (meaning this test can be used) for the duration of the COVID-19 declaration under Section 564(b)(1) of the Act, 21 U.S.C. section 360bbb-3(b)(1), unless the authorization is terminated or revoked.  Performed at [facility], [address] [place], [place] [address]          Radiology Studies: CARDIAC CATHETERIZATION  Result Date: 06/02/2020  Mid RCA lesion is 90% stenosed.  Prox LAD to Mid LAD lesion is 95% stenosed.  2nd Diag lesion is 75% stenosed.  Mid LAD lesion is 50% stenosed.  Ost Cx to Prox Cx lesion is 99% stenosed.  Prox Cx to Mid Cx lesion is 75% stenosed.  Faint collaterals right left  Low normal left ventricular function of around 50%  Conclusion Low normal left ventricular function EF of 50% Normal left main High-grade lesion in LAD proximal to mid 95% High-grade lesion in proximal circumflex 99% High-grade lesion in mid RCA 90% Intervention deferred Patient referred for coronary bypass surgery at a tertiary care center   DG Chest Portable 1 View  Result Date: 06/02/2020 CLINICAL DATA:  Chest pain EXAM: PORTABLE CHEST 1 VIEW COMPARISON:  None. FINDINGS: 1132 hours. The lungs are clear without focal pneumonia, edema, pneumothorax or pleural effusion. Mild vascular congestion. Cardiopericardial silhouette is at upper limits  of normal for size. The visualized bony structures of the thorax show no acute abnormality. Telemetry leads overlie the chest. IMPRESSION: Mild vascular congestion. Electronically Signed   By: Kennith Center M.D.   On: 06/02/2020 11:44        Scheduled Meds: . aspirin  81 mg Oral Daily  . atorvastatin  40 mg Oral Daily  . losartan  25 mg Oral Daily  . metoprolol tartrate  12.5 mg Oral BID  . sodium chloride flush  3 mL Intravenous Q12H  . sodium chloride flush  3 mL Intravenous Q12H   Continuous Infusions: . sodium chloride    . heparin 850 Units/hr (06/03/20 0826)     LOS: 1 day    Time spent: 40 min  Silvano Bilis, MD [facility]   If 7PM-7AM, please contact night-coverage [email or web address] Password [facility] 06/03/2020, 3:18 PM

## 2020-06-03 NOTE — Progress Notes (Signed)
Pt stable at this time. Report called to [facility], pt enroute now.

## 2020-06-03 NOTE — Consult Note (Signed)
ANTICOAGULATION CONSULT NOTE - Consult  Pharmacy Consult for Heparin gtt Indication: chest pain/ACS  Allergies  Allergen Reactions   Penicillins Rash    Patient Measurements: Height: 5\' 4"  (162.6 cm) Weight: 74.1 kg (163 lb 6.4 oz) IBW/kg (Calculated) : 59.2 Heparin Dosing Weight: 72.1 kg  Vital Signs: Temp: 98.5 F (36.9 C) (12/14 0805) Temp Source: Oral (12/14 0805) BP: 155/98 (12/14 0805) Pulse Rate: 78 (12/14 0805)  Labs: Recent Labs    06/02/20 0952 06/02/20 1127 06/02/20 2038 06/03/20 0428  HGB 16.6  --   --  12.9*  HCT 45.4  --   --  36.0*  PLT 214  --   --  192  CREATININE 1.44*  --  1.38* 1.48*  TROPONINIHS 3,106* 4,435*  --   --     Estimated Creatinine Clearance: 50.8 mL/min (A) (by C-G formula based on SCr of 1.48 mg/dL (H)).   Medications:  PTA - ASA 81mg , Prednisone 10mg  QD  Assessment: 59 year old male presenting with chest pain x 3 weeks. PMH includes HTN and GERD. Pt underwent cath procedure with sheath removal @ 1554. Heparin was not restarted 12/13 @ 2330  as planned. Restarting heparin gtt ASAP at prior rate of 850units/hr. No current interacting inpatient medications. No AC pertinent drug allergy. Trops 4435. Hgb 12.9, plt WNL.  Goal of Therapy:  Heparin level 0.3-0.7 units/ml Monitor platelets by anticoagulation protocol: Yes   Plan:  - Pt underwent cath procedure with sheath removal @ 1554. Heparin was not restarted 12/13 @ 2330 as planned. Restarting heparin gtt ASAP at prior rate of 850units/hr. - Check anti-Xa level in 6 hours and daily while on heparin - Continue to monitor H&H and platelets  60, PharmD Pharmacy Resident  06/03/2020 8:15 AM

## 2020-06-03 NOTE — Discharge Summary (Signed)
Manuel Medina:[id number] DOB: Jan 17, 1961 DOA: 06/02/2020  PCP: Patient, No Pcp Per  Admit date: 06/02/2020 Discharge date: 06/03/2020  Time spent: 35 minutes     Discharge Diagnoses:  Active Problems:   Elevated troponin   NSTEMI (non-ST elevated myocardial infarction) (HCC)   HTN (hypertension)   Discharge Condition: stable  Diet recommendation: heart healthy  Filed Weights   06/02/20 0949 06/02/20 1433 06/03/20 0338  Weight: 72.1 kg 72.1 kg 74.1 kg    History of present illness:  Manuel Medina a 59 y.o.malewith medical history significant ofhypertension, GERD presents in the emergency department with intermittent chest pain and pressure for 3 weeks. Patient reports having substernal chest pain thatstarted 3 weeks ago which was getting worse with food,he thought it could be related to worsening GERD,he went to see his primary care physician who has prescribed him some antacid which has helped his pain significantly but then he developed chest pressure which has been getting worse. Chest pain was not related to exertion. He has profuse sweating today morning after he woke up,denied shortness of breath,dizziness and palpitation.   Hospital Course:  Chest pain secondary toNSTEMI: Patient presents with intermittent chest pressure and pain for 3 weeks. Chest pain gotten worse today associated with profuse sweating and nausea. Patient was given aspirin 325 on arrival, EKG showsdiffuseST depression. Elevated troponins3106 - 4435 Started on heparin gtt. LHC shows 3-vessel disease, no PCI. Patient started on heparin drip, cardiology consulted. Plan is continue heparin, transfer to outside hospital for CABG. Dr. Juliann Pares has spoken w/ Dr. Nance Pew at [facility] and patient has been accepted there. continuing aspirin, metoprolol, losartan, and atorvastatin. Nitro prn  Acute kidney injury could be secondary to dehydration. Vs ckd as no recent creatinines  to compare to and did have elevated creatinine of 1.2 several years ago. Here does not appear volume down and cr 1.3-1.4 - cont metoprolol, losartan  Hypokalemia: K 3.4 this morning, repleted  Hypertension: Here bp elevated - have started metoprolol and losartan   Procedures:  Left heart cath   Consultations:  cardiology  Discharge Exam: Vitals:   06/03/20 1218 06/03/20 1653  BP: (!) 164/101 (!) 162/104  Pulse: 85 77  Resp:  18  Temp: 98.1 F (36.7 C) 98 F (36.7 C)  SpO2: 97% 97%    General exam: Appears calm and comfortable  Respiratory system: Clear to auscultation. Respiratory effort normal. Cardiovascular system: S1 & S2 heard, RRR. No JVD, murmurs, rubs, gallops or clicks. No pedal edema. Gastrointestinal system: Abdomen is nondistended, soft and nontender. No organomegaly or masses felt. Normal bowel sounds heard. Central nervous system: Alert and oriented. No focal neurological deficits. Extremities: Symmetric 5 x 5 power. Skin: No rashes, lesions or ulcers Psychiatry: Judgement and insight appear normal. Mood & affect appropriate.   Discharge Instructions   Discharge Instructions    Diet - low sodium heart healthy   Complete by: As directed    Increase activity slowly   Complete by: As directed      Allergies as of 06/03/2020      Reactions   Penicillins Rash      Medication List    STOP taking these medications   lisinopril 5 MG tablet Commonly known as: ZESTRIL   oxyCODONE-acetaminophen 5-325 MG tablet Commonly known as: Roxicet   predniSONE 10 MG tablet Commonly known as: DELTASONE      Allergies  Allergen Reactions  . Penicillins Rash      The results of significant diagnostics  from this hospitalization (including imaging, microbiology, ancillary and laboratory) are listed below for reference.    Significant Diagnostic Studies: CARDIAC CATHETERIZATION  Result Date: 06/02/2020  Mid RCA lesion is 90% stenosed.  Prox LAD  to Mid LAD lesion is 95% stenosed.  2nd Diag lesion is 75% stenosed.  Mid LAD lesion is 50% stenosed.  Ost Cx to Prox Cx lesion is 99% stenosed.  Prox Cx to Mid Cx lesion is 75% stenosed.  Faint collaterals right left  Low normal left ventricular function of around 50%  Conclusion Low normal left ventricular function EF of 50% Normal left main High-grade lesion in LAD proximal to mid 95% High-grade lesion in proximal circumflex 99% High-grade lesion in mid RCA 90% Intervention deferred Patient referred for coronary bypass surgery at a tertiary care center   DG Chest Portable 1 View  Result Date: 06/02/2020 CLINICAL DATA:  Chest pain EXAM: PORTABLE CHEST 1 VIEW COMPARISON:  None. FINDINGS: 1132 hours. The lungs are clear without focal pneumonia, edema, pneumothorax or pleural effusion. Mild vascular congestion. Cardiopericardial silhouette is at upper limits of normal for size. The visualized bony structures of the thorax show no acute abnormality. Telemetry leads overlie the chest. IMPRESSION: Mild vascular congestion. Electronically Signed   By: Kennith Center M.D.   On: 06/02/2020 11:44   ECHOCARDIOGRAM COMPLETE  Result Date: 06/03/2020    ECHOCARDIOGRAM REPORT   Patient Name:   Manuel Medina Date of Exam: 06/03/2020 Medical Rec #:  [id number]        Height:       64.0 in Accession #:    [id number]       Weight:       163.4 lb Date of Birth:  06/22/1960         BSA:          1.795 m Patient Age:    59 years         BP:           155/98 mmHg Patient Gender: M                HR:           78 bpm. Exam Location:  [facility] Procedure: 2D Echo, Color Doppler, Cardiac Doppler and Intracardiac            Opacification Agent Indications:     R07.9 Chest Pain  History:         Patient has no prior history of Echocardiogram examinations.                  Risk Factors:Hypertension.  Sonographer:     Humphrey Rolls RDCS (AE) Referring Phys:  [id number] Cipriano Bunker Diagnosing Phys: Alwyn Pea MD  Sonographer  Comments: Suboptimal apical window and suboptimal subcostal window. IMPRESSIONS  1. Left ventricular ejection fraction, by estimation, is 35 to 40%. The left ventricle has moderately decreased function. The left ventricle demonstrates global hypokinesis. The left ventricular internal cavity size was mildly dilated. Left ventricular diastolic parameters were normal.  2. Right ventricular systolic function is normal. The right ventricular size is normal.  3. The mitral valve is normal in structure. No evidence of mitral valve regurgitation.  4. The aortic valve is grossly normal. Aortic valve regurgitation is not visualized. FINDINGS  Left Ventricle: Left ventricular ejection fraction, by estimation, is 35 to 40%. The left ventricle has moderately decreased function. The left ventricle demonstrates global hypokinesis. Definity contrast agent was given IV to delineate the  left ventricular endocardial borders. The left ventricular internal cavity size was mildly dilated. There is no left ventricular hypertrophy. Left ventricular diastolic parameters were normal. Right Ventricle: The right ventricular size is normal. No increase in right ventricular wall thickness. Right ventricular systolic function is normal. Left Atrium: Left atrial size was normal in size. Right Atrium: Right atrial size was normal in size. Pericardium: There is no evidence of pericardial effusion. Mitral Valve: The mitral valve is normal in structure. No evidence of mitral valve regurgitation. MV peak gradient, 5.6 mmHg. The mean mitral valve gradient is 2.0 mmHg. Tricuspid Valve: The tricuspid valve is normal in structure. Tricuspid valve regurgitation is not demonstrated. Aortic Valve: The aortic valve is grossly normal. Aortic valve regurgitation is not visualized. Aortic valve mean gradient measures 2.0 mmHg. Aortic valve peak gradient measures 3.7 mmHg. Aortic valve area, by VTI measures 2.49 cm. Pulmonic Valve: The pulmonic valve was normal  in structure. Pulmonic valve regurgitation is not visualized. Aorta: The aortic arch was not well visualized. IAS/Shunts: No atrial level shunt detected by color flow Doppler.  LEFT VENTRICLE PLAX 2D LVIDd:         4.41 cm  Diastology LVIDs:         3.19 cm  LV e' medial:    6.09 cm/s LV PW:         1.15 cm  LV E/e' medial:  17.9 LV IVS:        1.02 cm  LV e' lateral:   5.11 cm/s LVOT diam:     2.10 cm  LV E/e' lateral: 21.3 LV SV:         50 LV SV Index:   28 LVOT Area:     3.46 cm  RIGHT VENTRICLE RV Basal diam:  2.68 cm LEFT ATRIUM             Index       RIGHT ATRIUM           Index LA diam:        3.30 cm 1.84 cm/m  RA Area:     11.20 cm LA Vol (A2C):   27.9 ml 15.54 ml/m RA Volume:   25.40 ml  14.15 ml/m LA Vol (A4C):   32.0 ml 17.82 ml/m LA Biplane Vol: 30.4 ml 16.93 ml/m  AORTIC VALVE                   PULMONIC VALVE AV Area (Vmax):    2.75 cm    PV Vmax:       1.02 m/s AV Area (Vmean):   2.45 cm    PV Vmean:      69.000 cm/s AV Area (VTI):     2.49 cm    PV VTI:        0.174 m AV Vmax:           96.70 cm/s  PV Peak grad:  4.2 mmHg AV Vmean:          76.000 cm/s PV Mean grad:  2.0 mmHg AV VTI:            0.202 m AV Peak Grad:      3.7 mmHg AV Mean Grad:      2.0 mmHg LVOT Vmax:         76.90 cm/s LVOT Vmean:        53.700 cm/s LVOT VTI:          0.145 m LVOT/AV VTI ratio: 0.72  AORTA  Ao Root diam: 3.30 cm MITRAL VALVE                TRICUSPID VALVE MV Area (PHT): 6.17 cm     TR Peak grad:   24.2 mmHg MV Peak grad:  5.6 mmHg     TR Vmax:        246.00 cm/s MV Mean grad:  2.0 mmHg MV Vmax:       1.18 m/s     SHUNTS MV Vmean:      70.7 cm/s    Systemic VTI:  0.14 m MV Decel Time: 123 msec     Systemic Diam: 2.10 cm MV E velocity: 109.00 cm/s MV A velocity: 103.00 cm/s MV E/A ratio:  1.06 Dwayne D Callwood MD Electronically signed by Alwyn Pea MD Signature Date/Time: 06/03/2020/4:57:38 PM    Final     Microbiology: Recent Results (from the past 240 hour(s))  Resp Panel by RT-PCR (Flu A&B,  Covid) Nasopharyngeal Swab     Status: None   Collection Time: 06/02/20 11:17 AM   Specimen: Nasopharyngeal Swab; Nasopharyngeal(NP) swabs in vial transport medium  Result Value Ref Range Status   SARS Coronavirus 2 by RT PCR NEGATIVE NEGATIVE Final    Comment: (NOTE) SARS-CoV-2 target nucleic acids are NOT DETECTED.  The SARS-CoV-2 RNA is generally detectable in upper respiratory specimens during the acute phase of infection. The lowest concentration of SARS-CoV-2 viral copies this assay can detect is 138 copies/mL. A negative result does not preclude SARS-Cov-2 infection and should not be used as the sole basis for treatment or other patient management decisions. A negative result may occur with  improper specimen collection/handling, submission of specimen other than nasopharyngeal swab, presence of viral mutation(s) within the areas targeted by this assay, and inadequate number of viral copies(<138 copies/mL). A negative result must be combined with clinical observations, patient history, and epidemiological information. The expected result is Negative.  Fact Sheet for Patients:  BloggerCourse.com  Fact Sheet for Healthcare Providers:  SeriousBroker.it  This test is no t yet approved or cleared by the [place] FDA and  has been authorized for detection and/or diagnosis of SARS-CoV-2 by FDA under an Emergency Use Authorization (EUA). This EUA will remain  in effect (meaning this test can be used) for the duration of the COVID-19 declaration under Section 564(b)(1) of the Act, 21 U.S.C.section 360bbb-3(b)(1), unless the authorization is terminated  or revoked sooner.       Influenza A by PCR NEGATIVE NEGATIVE Final   Influenza B by PCR NEGATIVE NEGATIVE Final    Comment: (NOTE) The Xpert Xpress SARS-CoV-2/FLU/RSV plus assay is intended as an aid in the diagnosis of influenza from Nasopharyngeal swab specimens and should  not be used as a sole basis for treatment. Nasal washings and aspirates are unacceptable for Xpert Xpress SARS-CoV-2/FLU/RSV testing.  Fact Sheet for Patients: BloggerCourse.com  Fact Sheet for Healthcare Providers: SeriousBroker.it  This test is not yet approved or cleared by the [place] FDA and has been authorized for detection and/or diagnosis of SARS-CoV-2 by FDA under an Emergency Use Authorization (EUA). This EUA will remain in effect (meaning this test can be used) for the duration of the COVID-19 declaration under Section 564(b)(1) of the Act, 21 U.S.C. section 360bbb-3(b)(1), unless the authorization is terminated or revoked.  Performed at [facility], [address], [place], [place] [address]      Labs: Basic Metabolic Panel: Recent Labs  Lab 06/02/20 [phone] 06/02/20 2038 06/03/20  0428  NA 141 140 140  K 3.0* 3.2* 3.4*  CL 105 105 106  CO2 24 27 26   GLUCOSE 146* 96 95  BUN 18 17 15   CREATININE 1.44* 1.38* 1.48*  CALCIUM 9.1 8.1* 8.3*  MG  --   --  2.1   Liver Function Tests: Recent Labs  Lab 06/02/20 0952  AST 41  ALT 37  ALKPHOS 32*  BILITOT 0.9  PROT 8.3*  ALBUMIN 4.0   Recent Labs  Lab 06/02/20 0952  LIPASE 39   No results for input(s): AMMONIA in the last 168 hours. CBC: Recent Labs  Lab 06/02/20 0952 06/03/20 0428  WBC 6.9 7.8  HGB 16.6 12.9*  HCT 45.4 36.0*  MCV 93.6 95.2  PLT 214 192   Cardiac Enzymes: No results for input(s): CKTOTAL, CKMB, CKMBINDEX, TROPONINI in the last 168 hours. BNP: BNP (last 3 results) No results for input(s): BNP in the last 8760 hours.  ProBNP (last 3 results) No results for input(s): PROBNP in the last 8760 hours.  CBG: No results for input(s): GLUCAP in the last 168 hours.     Signed:  Silvano BilisNoah B Jadier Rockers MD.  [facility] 06/03/2020, 5:54 PM

## 2020-06-03 NOTE — Consult Note (Signed)
ANTICOAGULATION CONSULT NOTE - Consult  Pharmacy Consult for Heparin gtt Indication: chest pain/ACS  Allergies  Allergen Reactions   Penicillins Rash    Patient Measurements: Height: 5\' 4"  (162.6 cm) Weight: 74.1 kg (163 lb 6.4 oz) IBW/kg (Calculated) : 59.2 Heparin Dosing Weight: 72.1 kg  Vital Signs: Temp: 98.8 F (37.1 C) (12/14 2233) Temp Source: Oral (12/14 2233) BP: 162/111 (12/14 2233) Pulse Rate: 79 (12/14 2233)  Labs: Recent Labs    06/02/20 0952 06/02/20 1127 06/02/20 2038 06/03/20 0428 06/03/20 1449 06/03/20 2220  HGB 16.6  --   --  12.9*  --   --   HCT 45.4  --   --  36.0*  --   --   PLT 214  --   --  192  --   --   HEPARINUNFRC  --   --   --   --  0.10* 0.42  CREATININE 1.44*  --  1.38* 1.48*  --   --   TROPONINIHS 3,106* 4,435*  --   --   --   --     Estimated Creatinine Clearance: 50.8 mL/min (A) (by C-G formula based on SCr of 1.48 mg/dL (H)).   Medications:  PTA - ASA 81mg , Prednisone 10mg  QD  Assessment: 59 year old male presenting with chest pain x 3 weeks. PMH includes HTN and GERD. Pt underwent cath procedure with sheath removal @ 1554. Heparin was not restarted 12/13 @ 2330  as planned. Restarting heparin gtt ASAP at prior rate of 850units/hr. No current interacting inpatient medications. No AC pertinent drug allergy. Trops 4435. Hgb 12.9, plt WNL.  12/14: heparin restarted following cath. HL @ 1449 0.10 12/14 1449 HL 0.10 12/14 2220 HL 0.42, therapeutic  Goal of Therapy:  Heparin level 0.3-0.7 units/ml Monitor platelets by anticoagulation protocol: Yes   Plan:  Continue heparin infusion at 1100 units/hr Recheck anti-Xa level in 6 hours and daily while on heparin Continue to monitor H&H and platelets with AM labs  1/15, PharmD, [facility] 06/03/2020 10:57 PM

## 2020-06-03 NOTE — Progress Notes (Signed)
*  PRELIMINARY RESULTS* Echocardiogram 2D Echocardiogram has been performed.  Manuel Medina 06/03/2020, 10:14 AM

## 2020-06-03 NOTE — Progress Notes (Signed)
[facility] Cardiology    SUBJECTIVE: Patient feels reasonably well denies any chest pain no shortness of breath.  No groin issues   Vitals:   06/02/20 1700 06/02/20 1723 06/02/20 1918 06/03/20 0338  BP: (!) 138/102 131/84 140/85 126/80  Pulse: 73 74 81 74  Resp: (!) 25 16 18 18   Temp:  98.3 F (36.8 C) 98.7 F (37.1 C) 98.2 F (36.8 C)  TempSrc:  Oral Oral Oral  SpO2: 96% 97% 96% 99%  Weight:    74.1 kg  Height:         Intake/Output Summary (Last 24 hours) at 06/03/2020 0700 Last data filed at 06/02/2020 2352 Gross per 24 hour  Intake --  Output 1 ml  Net -1 ml      PHYSICAL EXAM  General: Well developed, well nourished, in no acute distress HEENT:  Normocephalic and atramatic Neck:  No JVD.  Lungs: Clear bilaterally to auscultation and percussion. Heart: HRRR . Normal S1 and S2 without gallops or murmurs.  Abdomen: Bowel sounds are positive, abdomen soft and non-tender  Msk:  Back normal, normal gait. Normal strength and tone for age. Extremities: No clubbing, cyanosis or edema.   Neuro: Alert and oriented X 3. Psych:  Good affect, responds appropriately   LABS: Basic Metabolic Panel: Recent Labs    06/02/20 2038 06/03/20 0428  NA 140 140  K 3.2* 3.4*  CL 105 106  CO2 27 26  GLUCOSE 96 95  BUN 17 15  CREATININE 1.38* 1.48*  CALCIUM 8.1* 8.3*   Liver Function Tests: Recent Labs    06/02/20 0952  AST 41  ALT 37  ALKPHOS 32*  BILITOT 0.9  PROT 8.3*  ALBUMIN 4.0   Recent Labs    06/02/20 0952  LIPASE 39   CBC: Recent Labs    06/02/20 0952 06/03/20 0428  WBC 6.9 7.8  HGB 16.6 12.9*  HCT 45.4 36.0*  MCV 93.6 95.2  PLT 214 192   Cardiac Enzymes: No results for input(s): CKTOTAL, CKMB, CKMBINDEX, TROPONINI in the last 72 hours. BNP: Invalid input(s): POCBNP D-Dimer: No results for input(s): DDIMER in the last 72 hours. Hemoglobin A1C: No results for input(s): HGBA1C in the last 72 hours. Fasting Lipid Panel: Recent Labs     06/03/20 0428  CHOL 130  HDL 26*  LDLCALC 84  TRIG 06/05/20  CHOLHDL 5.0   Thyroid Function Tests: No results for input(s): TSH, T4TOTAL, T3FREE, THYROIDAB in the last 72 hours.  Invalid input(s): FREET3 Anemia Panel: No results for input(s): VITAMINB12, FOLATE, FERRITIN, TIBC, IRON, RETICCTPCT in the last 72 hours.  CARDIAC CATHETERIZATION  Result Date: 06/02/2020  Mid RCA lesion is 90% stenosed.  Prox LAD to Mid LAD lesion is 95% stenosed.  2nd Diag lesion is 75% stenosed.  Mid LAD lesion is 50% stenosed.  Ost Cx to Prox Cx lesion is 99% stenosed.  Prox Cx to Mid Cx lesion is 75% stenosed.  Faint collaterals right left  Low normal left ventricular function of around 50%  Conclusion Low normal left ventricular function EF of 50% Normal left main High-grade lesion in LAD proximal to mid 95% High-grade lesion in proximal circumflex 99% High-grade lesion in mid RCA 90% Intervention deferred Patient referred for coronary bypass surgery at a tertiary care center   DG Chest Portable 1 View  Result Date: 06/02/2020 CLINICAL DATA:  Chest pain EXAM: PORTABLE CHEST 1 VIEW COMPARISON:  None. FINDINGS: 1132 hours. The lungs are clear without focal pneumonia, edema, pneumothorax  or pleural effusion. Mild vascular congestion. Cardiopericardial silhouette is at upper limits of normal for size. The visualized bony structures of the thorax show no acute abnormality. Telemetry leads overlie the chest. IMPRESSION: Mild vascular congestion. Electronically Signed   By: Kennith Center M.D.   On: 06/02/2020 11:44     Echo pending  TELEMETRY: Normal sinus rhythm nonspecific ST-T changes heart rate of 85  ASSESSMENT AND PLAN:  Active Problems:   Elevated troponin   NSTEMI (non-ST elevated myocardial infarction) (HCC)   HTN (hypertension) Status post cardiac cath Multivessel coronary disease Hyperlipidemia Elevated blood sugar Chronic renal insufficiency Hypokalemia  Plan Agree with  telemetry Continue aspirin 81 mg daily Continue EKGs and troponins Metoprolol 12.5mg   tartrate twice a day Recommend echocardiogram for evaluation of left ventricular function Lipitor for hyperlipidemia Consider nephrology follow-up and evaluation Start low-dose losartan 25 mg to help with hypertension Patient on heparin therapy for anticoagulation for non-STEMI Recommend beta-blocker ARB Recommend transfer to [facility] for coronary bypass surgery   Alwyn Pea, MD 06/03/2020 7:00 AM

## 2020-06-09 DIAGNOSIS — Z951 Presence of aortocoronary bypass graft: Secondary | ICD-10-CM | POA: Insufficient documentation

## 2020-11-08 ENCOUNTER — Ambulatory Visit
Admission: EM | Admit: 2020-11-08 | Discharge: 2020-11-08 | Disposition: A | Discharge status: Home / Self Care | Payer: Managed Care, Other (non HMO) | Attending: Family Medicine | Admitting: Family Medicine

## 2020-11-08 ENCOUNTER — Ambulatory Visit (INDEPENDENT_AMBULATORY_CARE_PROVIDER_SITE_OTHER): Payer: Managed Care, Other (non HMO)

## 2020-11-08 ENCOUNTER — Other Ambulatory Visit: Payer: Self-pay

## 2020-11-08 DIAGNOSIS — N200 Calculus of kidney: Secondary | ICD-10-CM

## 2020-11-08 DIAGNOSIS — R109 Unspecified abdominal pain: Secondary | ICD-10-CM

## 2020-11-08 DIAGNOSIS — R10A0 Flank pain, unspecified side: Secondary | ICD-10-CM

## 2020-11-08 LAB — URINALYSIS, COMPLETE (UACMP) WITH MICROSCOPIC
Bilirubin Urine: NEGATIVE
Glucose, UA: NEGATIVE mg/dL
Hgb urine dipstick: NEGATIVE
Ketones, ur: NEGATIVE mg/dL
Nitrite: NEGATIVE
Protein, ur: NEGATIVE mg/dL
Specific Gravity, Urine: 1.015 (ref 1.005–1.030)
pH: 5.5 (ref 5.0–8.0)

## 2020-11-08 MED ORDER — HYDROCODONE-ACETAMINOPHEN 5-325 MG PO TABS: 1.0000 | ORAL_TABLET | Freq: Three times a day (TID) | ORAL | 0 refills | Status: DC | PRN

## 2020-11-08 MED ORDER — TAMSULOSIN HCL 0.4 MG PO CAPS: 0.4000 mg | ORAL_CAPSULE | Freq: Every day | ORAL | 0 refills | Status: DC

## 2020-11-08 NOTE — Discharge Instructions (Addendum)
Call urology on Monday.  Medication as prescribed.  Take care  Dr. Adriana Simas

## 2020-11-08 NOTE — ED Triage Notes (Signed)
Patient states that he is having back pain and is concerned that he has a kidney stone. States that pain has been constant since onset on Wednesday.

## 2020-11-09 NOTE — ED Provider Notes (Signed)
[facility]    CSN: [id number] Arrival date & time: 11/08/20  1406      History   Chief Complaint Chief Complaint  Patient presents with  . Back Pain   HPI  60 year old male presents with the above complaint.  Patient is reporting right-sided back pain/flank pain.  Started on Wednesday.  Has been intermittent.  Described as sharp at times.  Worse with bending and doing certain activities at times.  He is currently pain-free.  He denies any gross hematuria.  Reports a history of kidney stone requiring intervention.  No other complaints.  Past Medical History:  Diagnosis Date  . Hypertension     Patient Active Problem List   Diagnosis Date Noted  . Elevated troponin 06/02/2020  . NSTEMI (non-ST elevated myocardial infarction) (HCC) 06/02/2020  . HTN (hypertension) 06/02/2020    Past Surgical History:  Procedure Laterality Date  . APPENDECTOMY    . LEFT HEART CATH AND CORONARY ANGIOGRAPHY N/A 06/02/2020   Procedure: LEFT HEART CATH AND CORONARY ANGIOGRAPHY and possible PCI and stent;  Surgeon: Alwyn Pea, MD;  Location: [facility] INVASIVE CV LAB;  Service: Cardiovascular;  Laterality: N/A;       Home Medications    Prior to Admission medications   Medication Sig Start Date End Date Taking? Authorizing Provider  amLODipine (NORVASC) 5 MG tablet Take 1 tablet by mouth daily. 10/29/20  Yes [provider]  atorvastatin (LIPITOR) 40 MG tablet Take 1 tablet by mouth daily as needed. 10/29/20  Yes [provider]  HYDROcodone-acetaminophen (NORCO/VICODIN) 5-325 MG tablet Take 1 tablet by mouth every 8 (eight) hours as needed. 11/08/20  Yes Jawanna Dykman G, DO  metoprolol tartrate (LOPRESSOR) 25 MG tablet Take 25 mg by mouth daily. 10/29/20  Yes [provider]  tamsulosin (FLOMAX) 0.4 MG CAPS capsule Take 1 capsule (0.4 mg total) by mouth daily. 11/08/20  Yes Tommie Sams, DO    Family History Family History  Problem Relation Age of  Onset  . Stroke Father     Social History Social History   Tobacco Use  . Smoking status: Never Smoker  . Smokeless tobacco: Never Used  Vaping Use  . Vaping Use: Never used  Substance Use Topics  . Alcohol use: No  . Drug use: Never     Allergies   Penicillins   Review of Systems Review of Systems Per HPI  Physical Exam Triage Vital Signs ED Triage Vitals  Enc Vitals Group     BP 11/08/20 1427 (!) 127/91     Pulse Rate 11/08/20 1427 (!) 57     Resp 11/08/20 1427 18     Temp 11/08/20 1427 98.3 F (36.8 C)     Temp Source 11/08/20 1427 Oral     SpO2 11/08/20 1427 98 %     Weight 11/08/20 1425 156 lb (70.8 kg)     Height 11/08/20 1425 5\' 4"  (1.626 m)     Head Circumference --      Peak Flow --      Pain Score 11/08/20 1425 10     Pain Loc --      Pain Edu? --      Excl. in GC? --    Updated Vital Signs BP (!) 127/91 (BP Location: Right Arm)   Pulse (!) 57   Temp 98.3 F (36.8 C) (Oral)   Resp 18   Ht 5\' 4"  (1.626 m)   Wt 70.8 kg   SpO2 98%  BMI 26.78 kg/m   Visual Acuity Right Eye Distance:   Left Eye Distance:   Bilateral Distance:    Right Eye Near:   Left Eye Near:    Bilateral Near:     Physical Exam Vitals and nursing note reviewed.  Constitutional:      General: He is not in acute distress.    Appearance: Normal appearance. He is not ill-appearing.  HENT:     Head: Normocephalic and atraumatic.  Eyes:     General:        Right eye: No discharge.        Left eye: No discharge.     Conjunctiva/sclera: Conjunctivae normal.  Cardiovascular:     Rate and Rhythm: Normal rate and regular rhythm.  Pulmonary:     Effort: Pulmonary effort is normal.     Breath sounds: Normal breath sounds. No wheezing, rhonchi or rales.  Musculoskeletal:     Comments: Mild tenderness palpation of the right paraspinal musculature of the lower thoracic spine.  Neurological:     Mental Status: He is alert.  Psychiatric:        Mood and Affect: Mood  normal.        Behavior: Behavior normal.    UC Treatments / Results  Labs (all labs ordered are listed, but only abnormal results are displayed) Labs Reviewed  URINALYSIS, COMPLETE (UACMP) WITH MICROSCOPIC - Abnormal; Notable for the following components:      Result Value   Leukocytes,Ua TRACE (*)    Bacteria, UA FEW (*)    All other components within normal limits  URINE CULTURE    EKG   Radiology DG Abd 1 View  Result Date: 11/08/2020 CLINICAL DATA:  Back pain. EXAM: ABDOMEN - 1 VIEW COMPARISON:  None. FINDINGS: The bowel gas pattern is normal. A large amount of stool is seen within the ascending colon. A 6 mm soft tissue calcification is seen projecting over the lower pole of the left kidney. IMPRESSION: 1. 6 mm renal calculus within the lower pole of the left kidney. Electronically Signed   By: Aram Candela M.D.   On: 11/08/2020 14:59    Procedures Procedures (including critical care time)  Medications Ordered in UC Medications - No data to display  Initial Impression / Assessment and Plan / UC Course  I have reviewed the triage vital signs and the nursing notes.  Pertinent labs & imaging results that were available during my care of the patient were reviewed by me and considered in my medical decision making (see chart for details).    60 year old male presents with back pain/flank pain.  His flank pain is located on the right side.  Slightly tender to palpation.  Given his history of nephrolithiasis, KUB was obtained.  KUB was independently interpreted by me.  Interpretation: Left-sided stone noted.  Measures 6 mm.  Given the fact that patient is having right flank pain, I am unsure if stone is the underlying culprit for his current symptomatology.  However, there was a lot of stool in the ascending colon which would likely prevent visualization of right-sided stone.  He did have hematuria on urinalysis.  Treating empirically with Flomax and as needed Vicodin.   Advised to follow-up with urology.  Supportive care.  Final Clinical Impressions(s) / UC Diagnoses   Final diagnoses:  Flank pain  Nephrolithiasis     Discharge Instructions     Call urology on Monday.  Medication as prescribed.  Take care  Dr. Adriana Simas  ED Prescriptions    Medication Sig Dispense Auth. Provider   tamsulosin (FLOMAX) 0.4 MG CAPS capsule Take 1 capsule (0.4 mg total) by mouth daily. 14 capsule Constanza Mincy G, DO   HYDROcodone-acetaminophen (NORCO/VICODIN) 5-325 MG tablet Take 1 tablet by mouth every 8 (eight) hours as needed. 10 tablet Everlene Other G, DO     I have reviewed the PDMP during this encounter.   Tommie Sams, [place] 11/09/20 [phone]

## 2020-11-10 LAB — URINE CULTURE: Culture: <10000 — AB

## 2020-11-14 ENCOUNTER — Other Ambulatory Visit: Payer: Self-pay | Admitting: Urology

## 2020-11-14 DIAGNOSIS — R3129 Other microscopic hematuria: Secondary | ICD-10-CM

## 2020-11-15 ENCOUNTER — Ambulatory Visit: Payer: Self-pay

## 2020-11-25 ENCOUNTER — Ambulatory Visit
Admission: RE | Admit: 2020-11-25 | Discharge: 2020-11-25 | Disposition: A | Discharge status: Home / Self Care | Payer: Managed Care, Other (non HMO) | Source: Ambulatory Visit | Attending: Urology | Admitting: Urology

## 2020-11-25 ENCOUNTER — Other Ambulatory Visit: Payer: Self-pay

## 2020-11-25 DIAGNOSIS — R3129 Other microscopic hematuria: Secondary | ICD-10-CM | POA: Diagnosis present

## 2020-11-27 ENCOUNTER — Ambulatory Visit: Payer: Managed Care, Other (non HMO) | Admitting: Surgery

## 2020-11-27 ENCOUNTER — Encounter: Payer: Self-pay | Admitting: Surgery

## 2020-11-27 ENCOUNTER — Other Ambulatory Visit: Payer: Self-pay

## 2020-11-27 VITALS — BP 126/85 | HR 78 | Temp 98.0°F | Ht 64.0 in | Wt 162.2 lb

## 2020-11-27 DIAGNOSIS — K4090 Unilateral inguinal hernia, without obstruction or gangrene, not specified as recurrent: Secondary | ICD-10-CM | POA: Diagnosis not present

## 2020-11-27 DIAGNOSIS — R1011 Right upper quadrant pain: Secondary | ICD-10-CM

## 2020-11-27 DIAGNOSIS — Z1211 Encounter for screening for malignant neoplasm of colon: Secondary | ICD-10-CM

## 2020-11-27 NOTE — Patient Instructions (Addendum)
Ultrasound scheduled on 12/24/2020 @ 7:45 am. Nothing to eat/drink after midnight. Please see your follow up appointment listed below.    [facility] [address] [place] [address]   Referral sent to [facility]. Someone from their office will call to schedule an appointment.

## 2020-11-27 NOTE — Progress Notes (Signed)
Patient ID: Manuel Medina, male   DOB: Aug 17, 1960, 60 y.o.   MRN: [id number]  Chief Complaint: Right upper quadrant pain  History of Present Illness Manuel Medina is a 60 y.o. male with a singular episode of right upper quadrant pain that awoke him from sleep, primarily localized in the CVA, with some radiation to the right upper quadrant.  No history of jaundice or hepatitis.  No known postprandial provocation.  No repeat episodes since.  Denies any nausea, vomiting, fevers or chills.  Reports no change in his bowel movements, but did have a little bit of bright red blood on wiping following taking some pain medication for kidney stones.  He had a CT of his abdomen pelvis which did not reveal any gallstones.  Past Medical History Past Medical History:  Diagnosis Date   Hypertension       Past Surgical History:  Procedure Laterality Date   APPENDECTOMY     LEFT HEART CATH AND CORONARY ANGIOGRAPHY N/A 06/02/2020   Procedure: LEFT HEART CATH AND CORONARY ANGIOGRAPHY and possible PCI and stent;  Surgeon: Alwyn Pea, MD;  Location: [facility] INVASIVE CV LAB;  Service: Cardiovascular;  Laterality: N/A;    Allergies  Allergen Reactions   Penicillins Rash    Current Outpatient Medications  Medication Sig Dispense Refill   acetaminophen (TYLENOL) 500 MG tablet Take by mouth.     amLODipine (NORVASC) 5 MG tablet Take by mouth.     aspirin 81 MG EC tablet Take by mouth.     atorvastatin (LIPITOR) 40 MG tablet Take 1 tablet by mouth daily as needed.     metoprolol tartrate (LOPRESSOR) 25 MG tablet Take 25 mg by mouth daily.     No current facility-administered medications for this visit.    Family History Family History  Problem Relation Age of Onset   Stroke Father       Social History Social History   Tobacco Use   Smoking status: Never   Smokeless tobacco: Never  Vaping Use   Vaping Use: Never used  Substance Use Topics   Alcohol use: No   Drug use: Never         Review of Systems  Constitutional: Negative.   HENT: Negative.    Eyes: Negative.   Respiratory: Negative.    Cardiovascular: Negative.   Gastrointestinal: Negative.   Genitourinary: Negative.   Skin: Negative.   Neurological: Negative.   Psychiatric/Behavioral: Negative.       Physical Exam Blood pressure 126/85, pulse 78, temperature 98 F (36.7 C), temperature source Oral, height 5\' 4"  (1.626 m), weight 162 lb 3.2 oz (73.6 kg), SpO2 96 %. Last Weight  Most recent update: 11/27/2020  8:55 AM    Weight  73.6 kg (162 lb 3.2 oz)             CONSTITUTIONAL: Well developed, and nourished, appropriately responsive and aware without distress.   EYES: Sclera non-icteric.   EARS, NOSE, MOUTH AND THROAT: Mask worn.  Hearing is intact to voice.  NECK: Trachea is midline, and there is no jugular venous distension.  LYMPH NODES:  Lymph nodes in the neck are not enlarged. RESPIRATORY:  Lungs are clear, and breath sounds are equal bilaterally. Normal respiratory effort without pathologic use of accessory muscles. CARDIOVASCULAR: Heart is regular in rate and rhythm. GI: The abdomen is soft, nontender, and nondistended. There were no palpable masses. I did not appreciate hepatosplenomegaly. There were normal bowel sounds. GU: Right inguinal hernia.  MUSCULOSKELETAL:  Symmetrical muscle tone appreciated in all four extremities.    SKIN: Skin turgor is normal. No pathologic skin lesions appreciated.  NEUROLOGIC:  Motor and sensation appear grossly normal.  Cranial nerves are grossly without defect. PSYCH:  Alert and oriented to person, place and time. Affect is appropriate for situation.  Data Reviewed I have personally reviewed what is currently available of the patient's imaging, recent labs and medical records.   Labs:  CBC Latest Ref Rng & Units 06/03/2020 06/02/2020 09/13/2016  WBC 4.0 - 10.5 K/uL 7.8 6.9 11.2(H)  Hemoglobin 13.0 - 17.0 g/dL 12.9(L) 16.6 15.5  Hematocrit 39.0 -  52.0 % 36.0(L) 45.4 45.2  Platelets 150 - 400 K/uL 192 214 209   CMP Latest Ref Rng & Units 06/03/2020 06/02/2020 06/02/2020  Glucose 70 - 99 mg/dL 95 96 784(O)  BUN 6 - 20 mg/dL 15 17 18   Creatinine 0.61 - 1.24 mg/dL ) 9.62(X) 5.28(U)  Sodium 135 - 145 mmol/L 140 140 141  Potassium 3.5 - 5.1 mmol/L 3.4(L) 3.2(L) 3.0(L)  Chloride 98 - 111 mmol/L 106 105 105  CO2 22 - 32 mmol/L 26 27 24   Calcium 8.9 - 10.3 mg/dL 8.3(L) 8.1(L) 9.1  Total Protein 6.5 - 8.1 g/dL - - 8.3(H)  Total Bilirubin 0.3 - 1.2 mg/dL - - 0.9  Alkaline Phos 38 - 126 U/L - - 32(L)  AST 15 - 41 U/L - - 41  ALT 0 - 44 U/L - - 37     Imaging: Radiology review:  CLINICAL DATA:  Right flank pain for 2 weeks that mid to the right lower quadrant and has gotten better.   EXAM: CT ABDOMEN AND PELVIS WITHOUT CONTRAST   TECHNIQUE: Multidetector CT imaging of the abdomen and pelvis was performed following the standard protocol without IV contrast.   COMPARISON:  None.   FINDINGS: Lower chest: No acute findings. Heart is enlarged. No pericardial or pleural effusion. Distal esophagus is grossly unremarkable.   Hepatobiliary: Probable focal fat along the falciform ligament. Liver and gallbladder are otherwise unremarkable. No biliary ductal dilatation.   Pancreas: Negative.   Spleen: Negative.   Adrenals/Urinary Tract: Adrenal glands are unremarkable. Stones are seen in the kidneys bilaterally. Ureters are decompressed. A 5 mm stone is seen in the bladder, at the right ureteral orifice. Bladder is otherwise unremarkable.   Stomach/Bowel: Stomach and small bowel are unremarkable. Appendix is not readily visualized. Stool is seen in the majority of the colon, indicative of constipation.   Vascular/Lymphatic: Atherosclerotic calcification of the aorta. Retroaortic left renal vein. No pathologically enlarged lymph nodes.   Reproductive: Prostate is visualized.   Other: Very large right inguinal hernia  contains fat, extending into the right scrotal sac. Small to moderate left inguinal hernia also contains fat. No free fluid. Mesenteries and peritoneum are unremarkable.   Musculoskeletal: Degenerative changes in the spine. No worrisome lytic or sclerotic lesions.   IMPRESSION: 1. 5 mm stone in the bladder, at the right ureteral orifice, presumably recently passed given the provided clinical history. No residual hydronephrosis. 2. Bilateral renal stones. 3. Bilateral inguinal hernias, very large on the right and small to moderate on the left. 4.  Aortic atherosclerosis (ICD10-I70.0).     Electronically Signed   By: 1.32(G M.D.   On: 11/25/2020 14:10 Within last 24 hrs: No results found.    Assessment    Right upper quadrant pain, possible gallstone related, will obtain abdominal ultrasound. Known right groin hernia. 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  Follow-up regarding discussion of ultrasound results and review of surgery for right inguinal hernia.  Face-to-face time spent with the patient and accompanying care providers(if present) was 30 minutes, with more than 50% of the time spent counseling, educating, and coordinating care of the patient.    These notes generated with voice recognition software. I apologize for typographical errors.  Campbell Lerner M.D., FACS 11/27/2020, 9:24 AM

## 2020-12-04 ENCOUNTER — Ambulatory Visit: Payer: Managed Care, Other (non HMO)

## 2020-12-09 ENCOUNTER — Ambulatory Visit: Payer: Self-pay | Admitting: Surgery

## 2020-12-24 ENCOUNTER — Ambulatory Visit: Payer: Managed Care, Other (non HMO) | Attending: Surgery

## 2021-01-06 ENCOUNTER — Ambulatory Visit: Payer: Self-pay | Admitting: Surgery

## 2022-10-07 IMAGING — CR DG ABDOMEN 1V
2 series · 2 of 2 positions shown · non-contrast
Comparison: None.

CLINICAL DATA: Back pain.

EXAM:
ABDOMEN - 1 VIEW

[abdomen kub (1 of 2)]
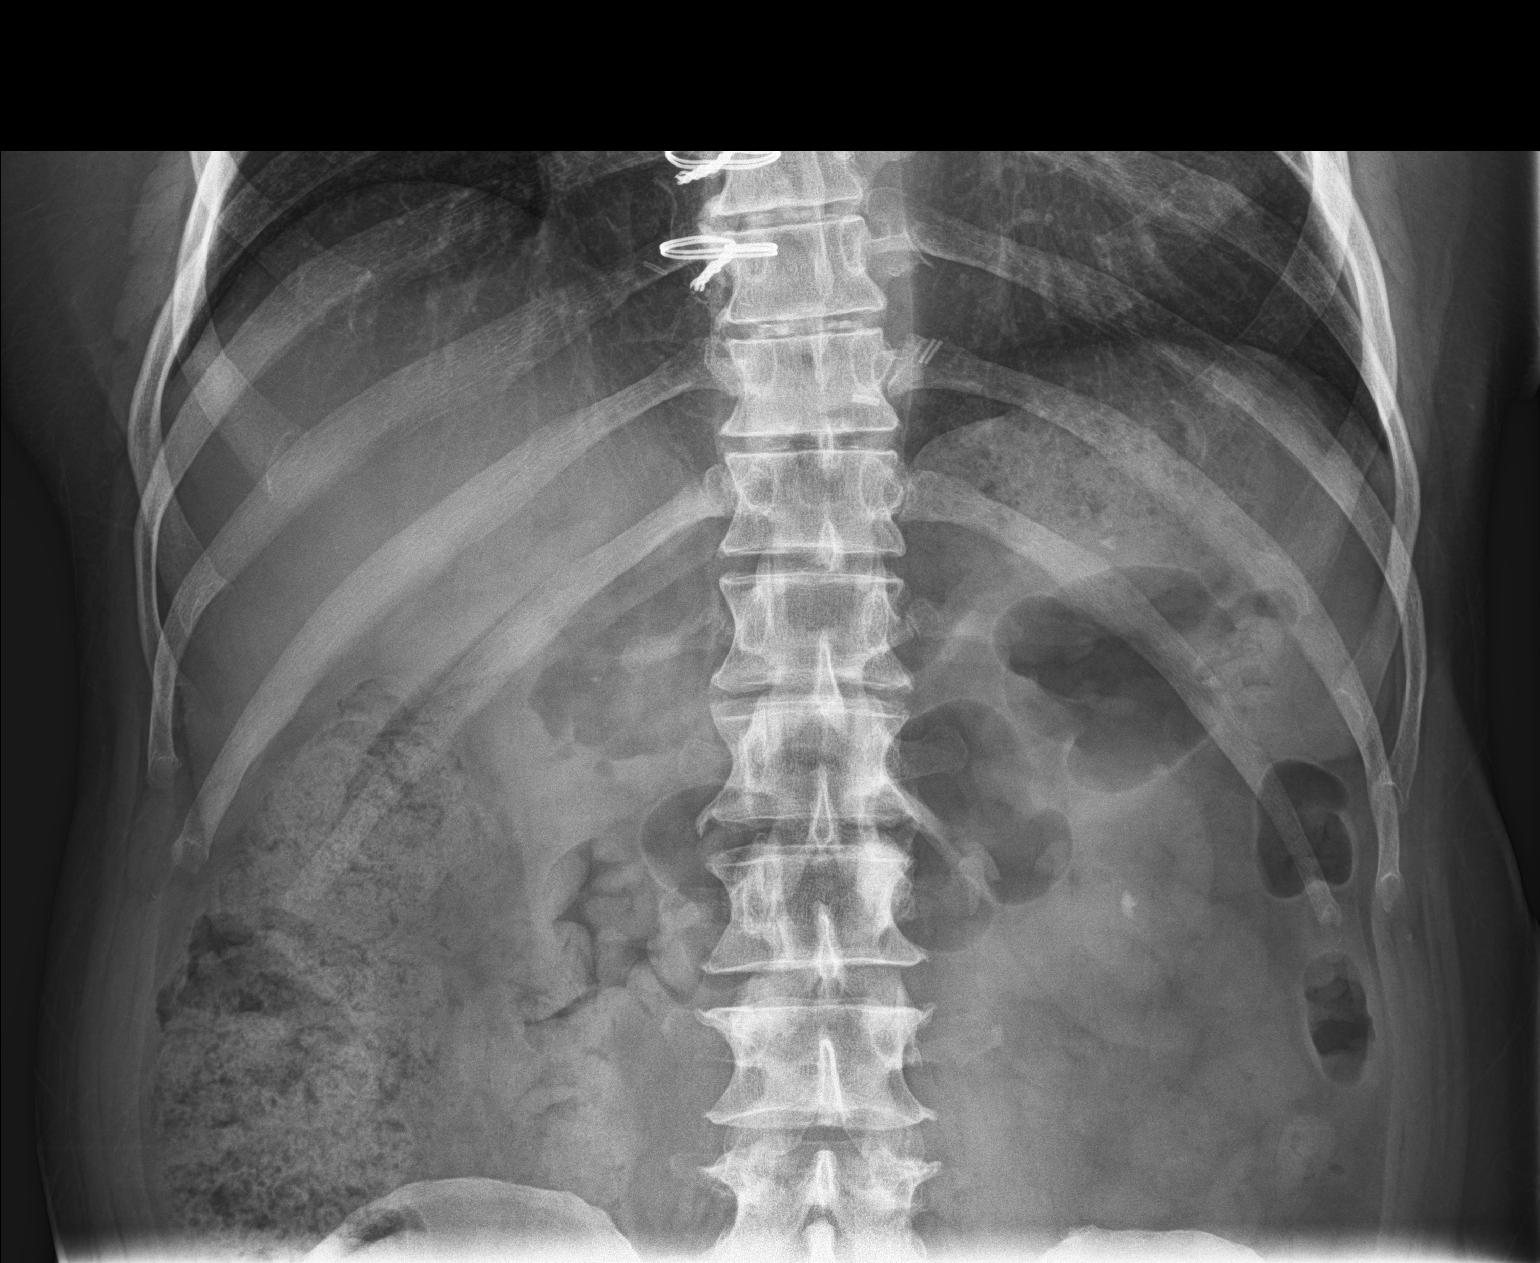

[abdomen kub (2 of 2)]
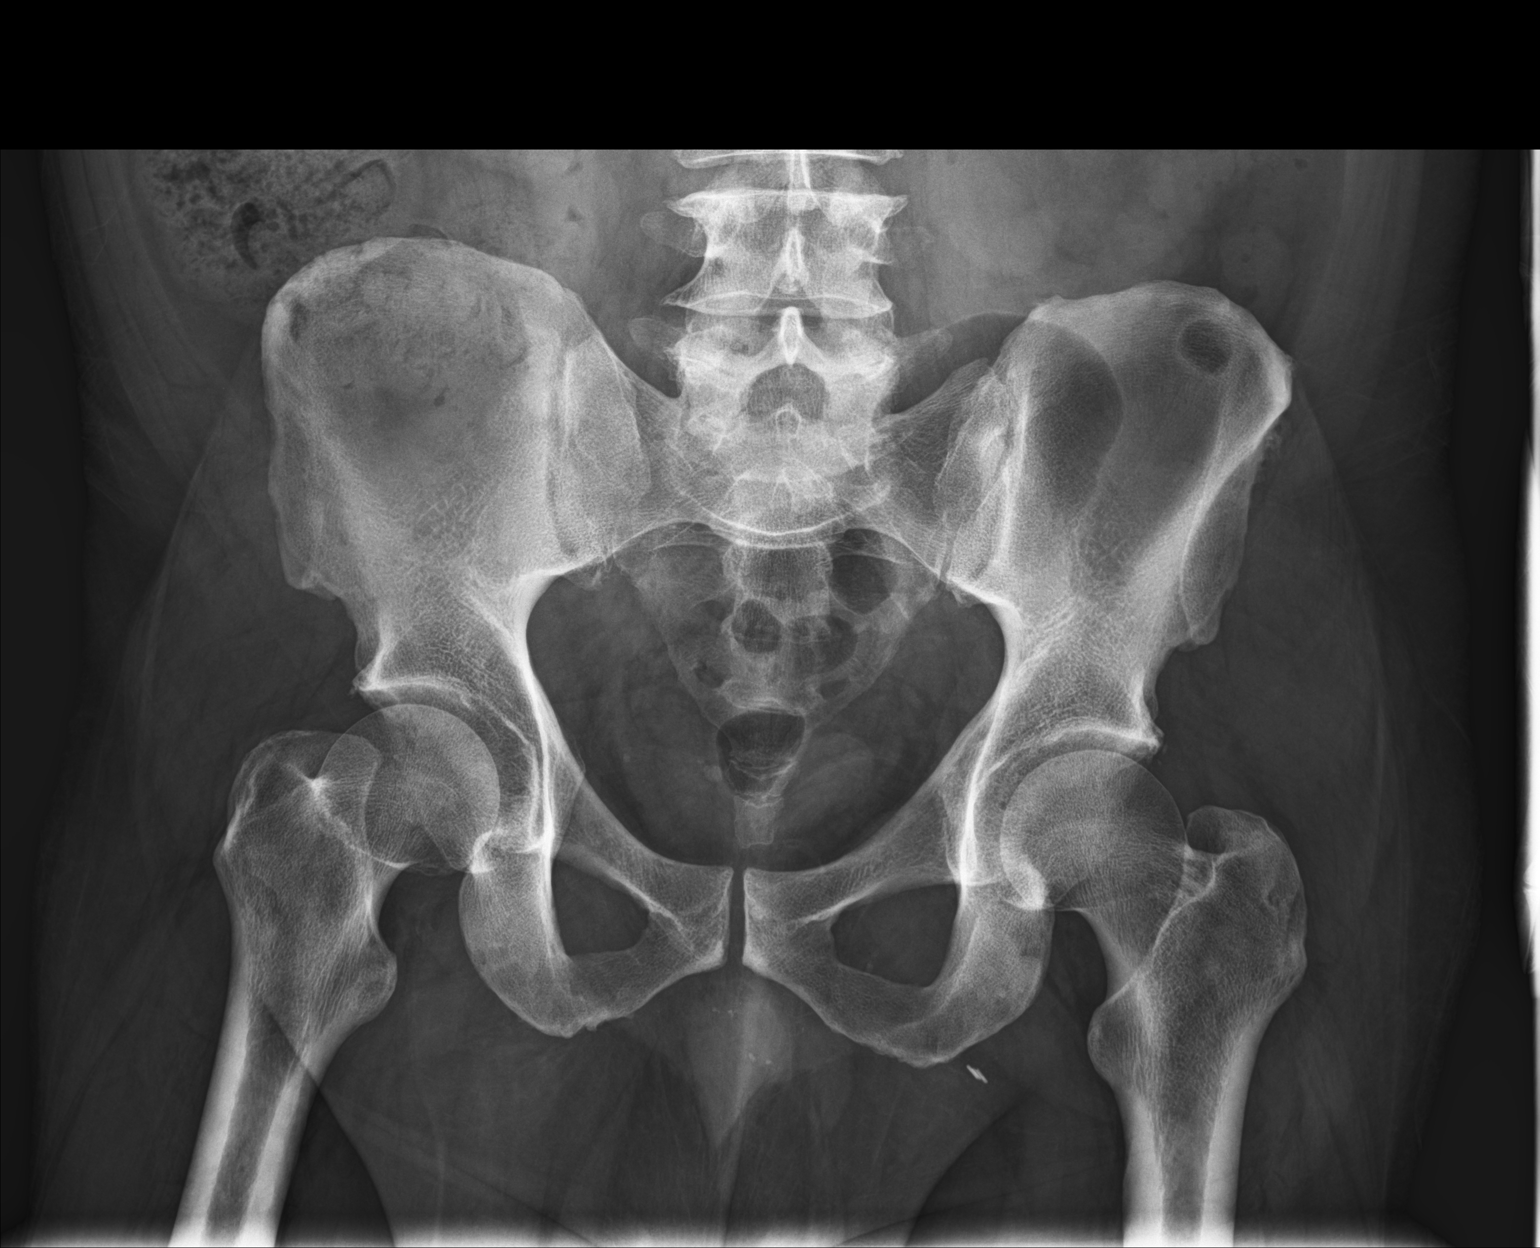

[2 of 2 positions shown; findings below may reference images not displayed]

FINDINGS: The bowel gas pattern is normal. A large amount of stool is seen
within the ascending colon. A 6 mm soft tissue calcification is seen
projecting over the lower pole of the left kidney.
IMPRESSION: 1. 6 mm renal calculus within the lower pole of the left kidney.

## 2022-10-24 IMAGING — CT CT ABD-PELV W/O CM
1 of 2 series · 15 of 32 positions shown, 19 images · non-contrast
Comparison: None.

CLINICAL DATA: Right flank pain for 2 weeks that mid to the right
lower quadrant and has gotten better.

EXAM:
CT ABDOMEN AND PELVIS WITHOUT CONTRAST
TECHNIQUE: Multidetector CT imaging of the abdomen and pelvis was performed
following the standard protocol without IV contrast.

[Series 2: axial st · axial · 0.74mm/px · z∈[-1028,-558]mm · 15 of 104 slices shown, 19 images]
[im 5/104  soft-tissue]
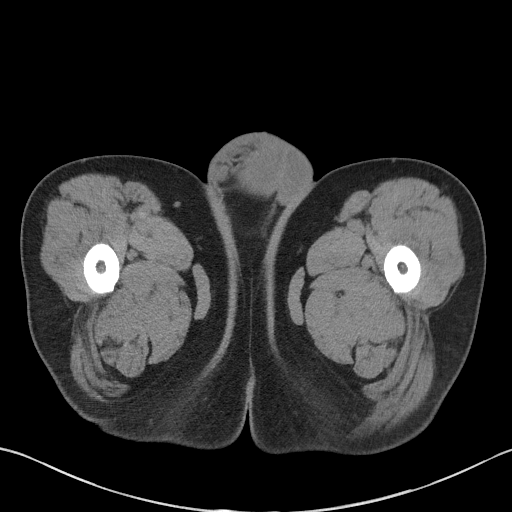
[im 5/104  bone]
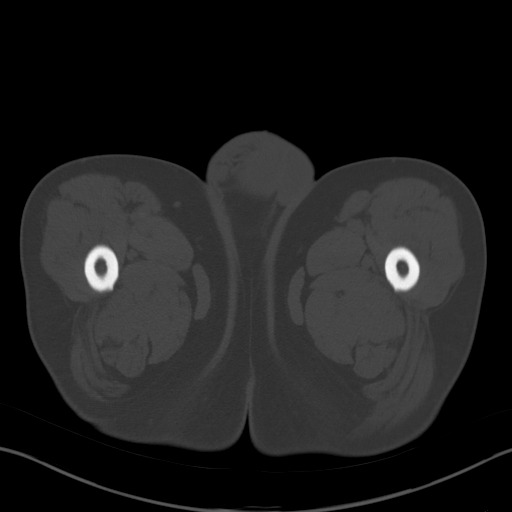
[im 13/104  soft-tissue]
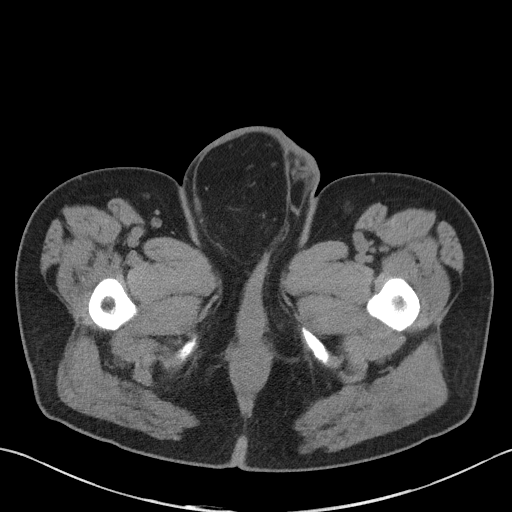
[im 22/104  soft-tissue]
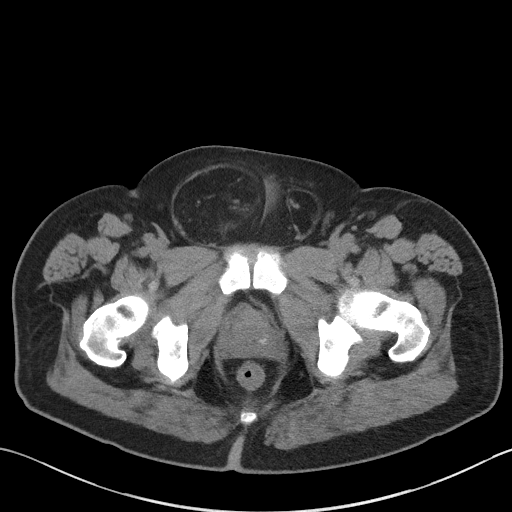
[im 31/104  soft-tissue]
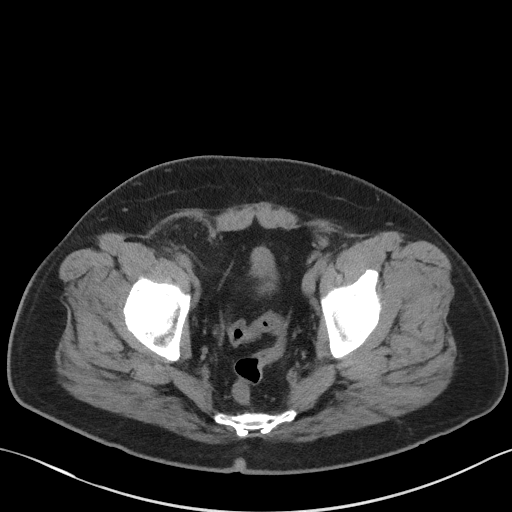
[im 35/104  soft-tissue]
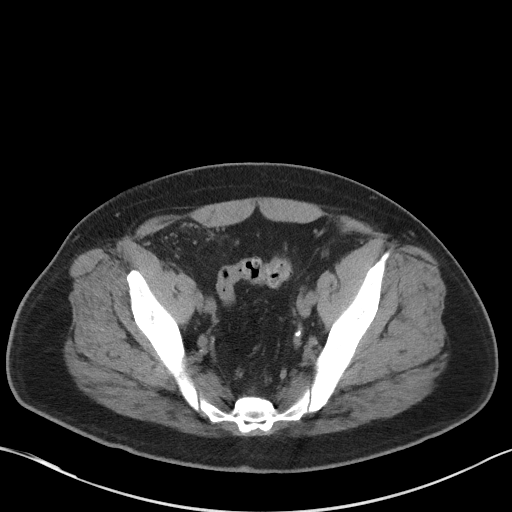
[im 43/104  soft-tissue]
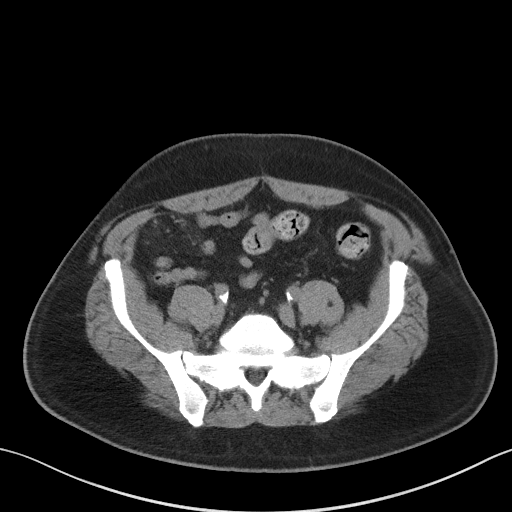
[im 52/104  soft-tissue]
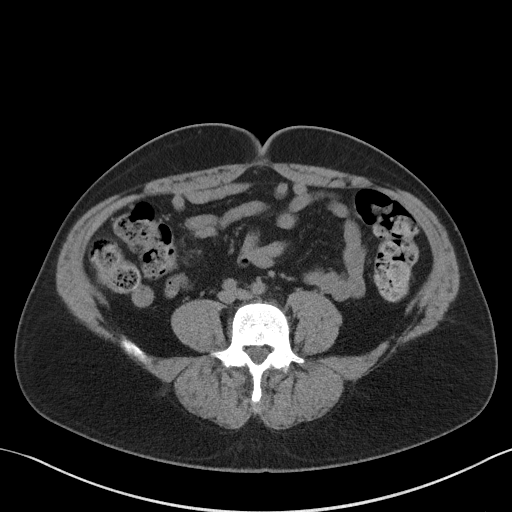
[im 61/104  soft-tissue]
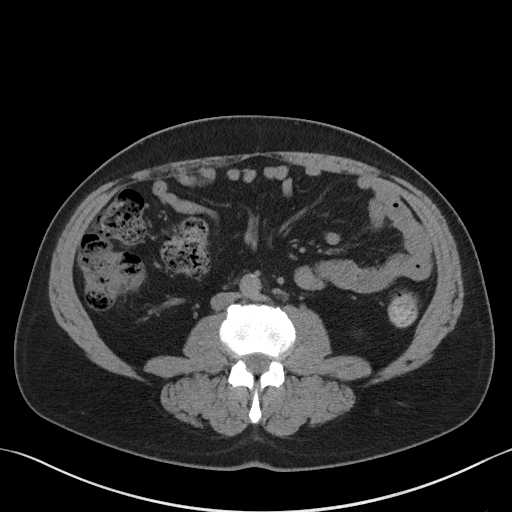
[im 69/104  soft-tissue]
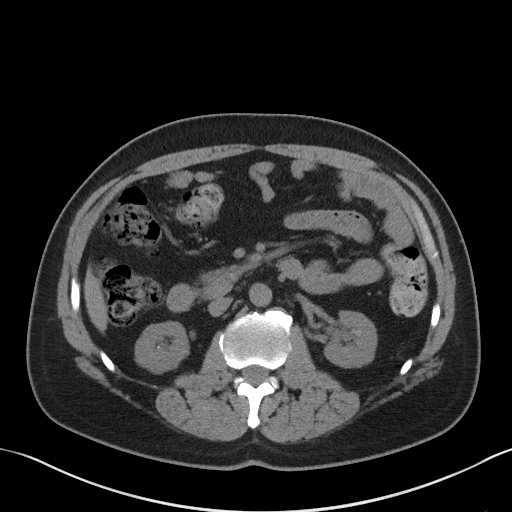
[im 69/104  bone]
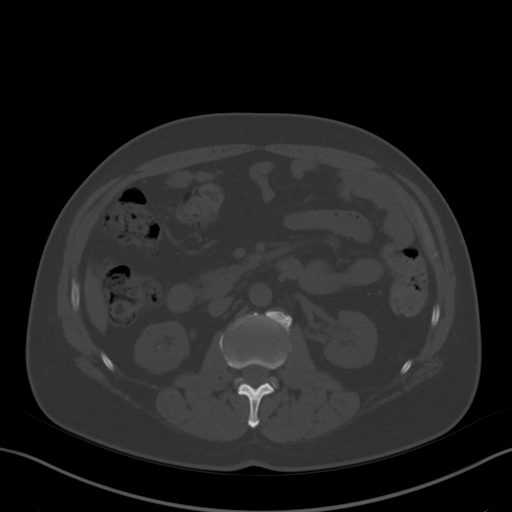
[im 73/104  soft-tissue]
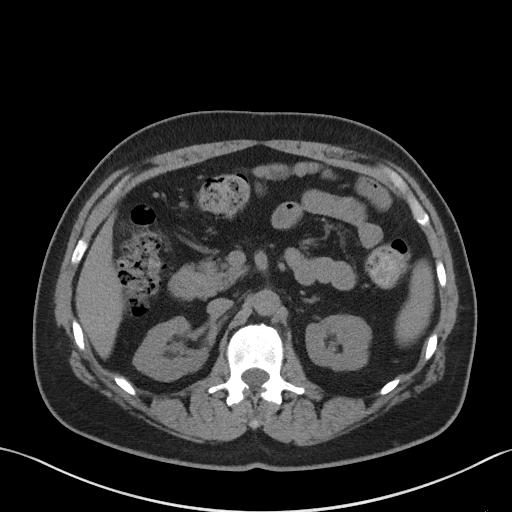
[im 82/104  soft-tissue]
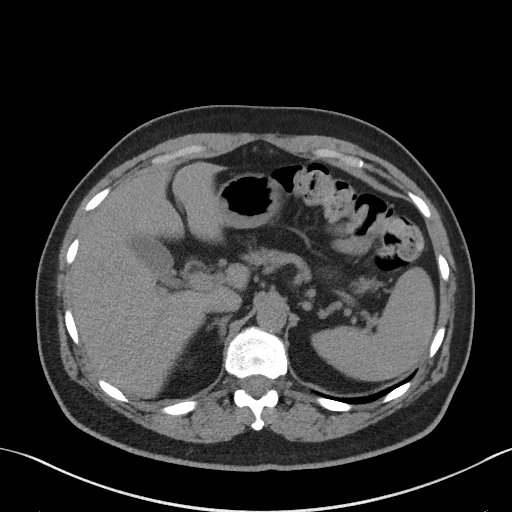
[im 86/104  lung]
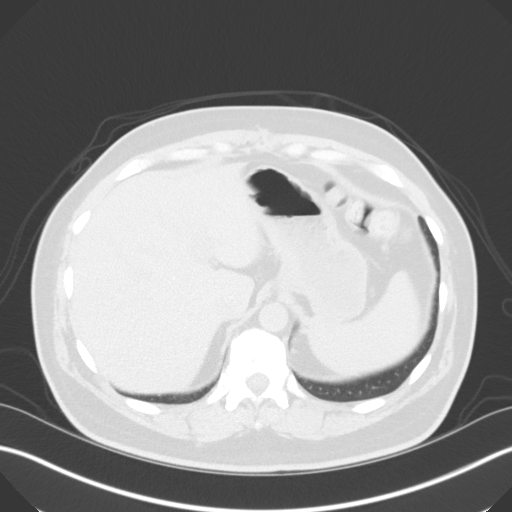
[im 91/104  soft-tissue]
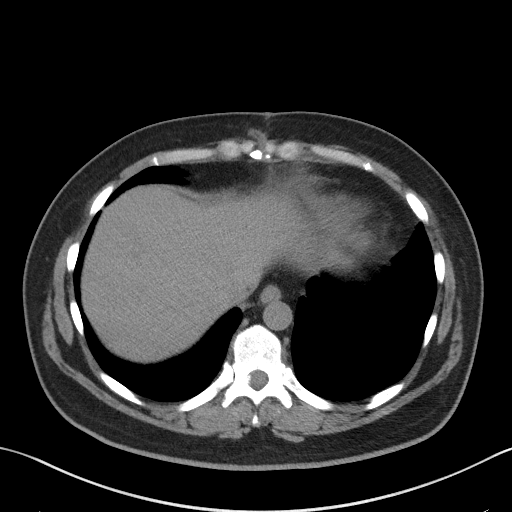
[im 91/104  lung]
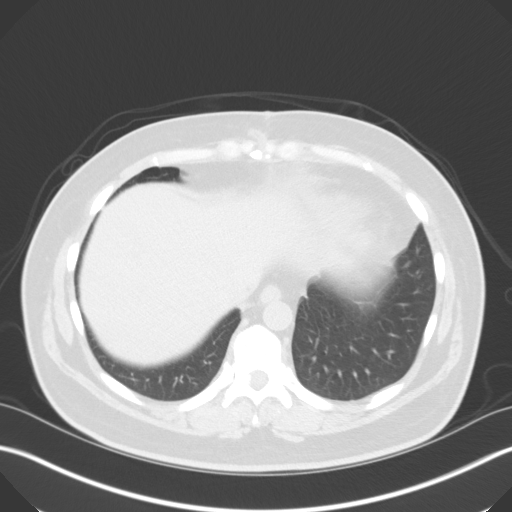
[im 95/104  lung]
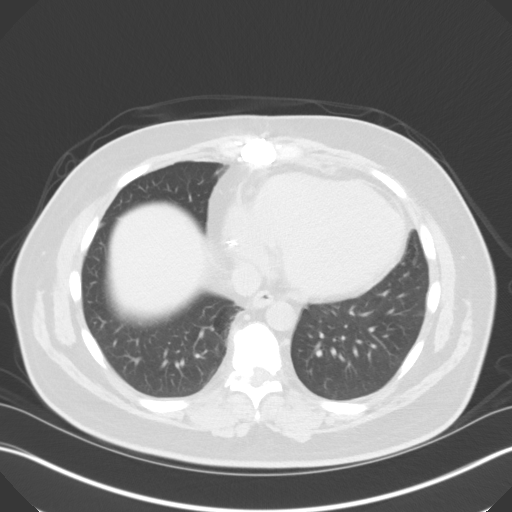
[im 99/104  soft-tissue]
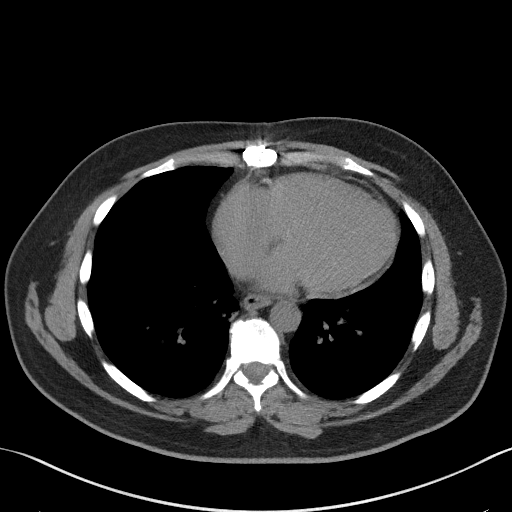
[im 99/104  lung]
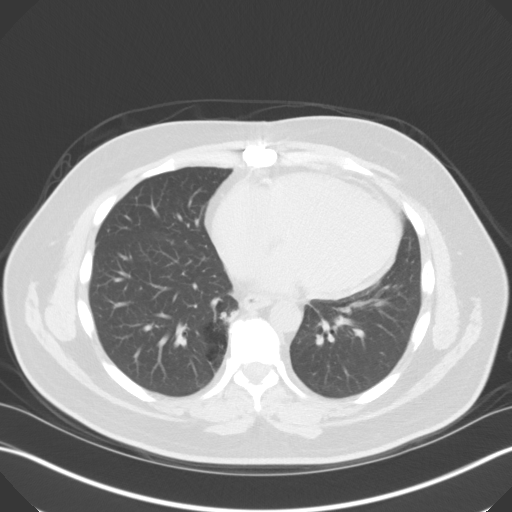

[15 of 32 positions shown; findings below may reference images not displayed]

FINDINGS: Lower chest: No acute findings. Heart is enlarged. No pericardial or
pleural effusion. Distal esophagus is grossly unremarkable.

Hepatobiliary: Probable focal fat along the falciform ligament.
Liver and gallbladder are otherwise unremarkable. No biliary ductal
dilatation.

Pancreas: Negative.

Spleen: Negative.

Adrenals/Urinary Tract: Adrenal glands are unremarkable. Stones are
seen in the kidneys bilaterally. Ureters are decompressed. A 5 mm
stone is seen in the bladder, at the right ureteral orifice. Bladder
is otherwise unremarkable.

Stomach/Bowel: Stomach and small bowel are unremarkable. Appendix is
not readily visualized. Stool is seen in the majority of the colon,
indicative of constipation.

Vascular/Lymphatic: Atherosclerotic calcification of the aorta.
Retroaortic left renal vein. No pathologically enlarged lymph nodes.

Reproductive: Prostate is visualized.

Other: Very large right inguinal hernia contains fat, extending into
the right scrotal sac. Small to moderate left inguinal hernia also
contains fat. No free fluid. Mesenteries and peritoneum are
unremarkable.

Musculoskeletal: Degenerative changes in the spine. No worrisome
lytic or sclerotic lesions.
IMPRESSION: 1. 5 mm stone in the bladder, at the right ureteral orifice,
presumably recently passed given the provided clinical history. No
residual hydronephrosis.
2. Bilateral renal stones.
3. Bilateral inguinal hernias, very large on the right and small to
moderate on the left.
4.  Aortic atherosclerosis ([id number]).
